# Patient Record
Sex: Female | Born: 1935 | Race: White | Hispanic: No | Smoking: Former smoker
Health system: Southern US, Community
[De-identification: ages and names within clinical notes are randomized; demographics above are authoritative.]

## PROBLEM LIST (undated history)

## (undated) DIAGNOSIS — R350 Frequency of micturition: Secondary | ICD-10-CM

## (undated) DIAGNOSIS — K5790 Diverticulosis of intestine, part unspecified, without perforation or abscess without bleeding: Secondary | ICD-10-CM

## (undated) DIAGNOSIS — K219 Gastro-esophageal reflux disease without esophagitis: Secondary | ICD-10-CM

## (undated) DIAGNOSIS — M1990 Unspecified osteoarthritis, unspecified site: Secondary | ICD-10-CM

## (undated) DIAGNOSIS — E039 Hypothyroidism, unspecified: Secondary | ICD-10-CM

## (undated) DIAGNOSIS — C801 Malignant (primary) neoplasm, unspecified: Secondary | ICD-10-CM

## (undated) DIAGNOSIS — R011 Cardiac murmur, unspecified: Secondary | ICD-10-CM

## (undated) HISTORY — PX: ABDOMINAL HYSTERECTOMY: SHX81

## (undated) HISTORY — PX: SHOULDER ARTHROSCOPY: SHX128

## (undated) HISTORY — PX: BACK SURGERY: SHX140

## (undated) HISTORY — PX: CHOLECYSTECTOMY: SHX55

## (undated) HISTORY — PX: ESOPHAGOGASTRODUODENOSCOPY: SHX1529

## (undated) HISTORY — PX: APPENDECTOMY: SHX54

## (undated) HISTORY — PX: COLONOSCOPY: SHX174

## (undated) HISTORY — PX: TONSILLECTOMY: SUR1361

## (undated) HISTORY — PX: CATARACT EXTRACTION: SUR2

## (undated) HISTORY — PX: LAPAROTOMY: SHX154

## (undated) HISTORY — PX: CARPAL TUNNEL RELEASE: SHX101

## (undated) HISTORY — PX: KNEE ARTHROSCOPY: SUR90

---

## 1986-02-13 HISTORY — PX: HERNIA REPAIR: SHX51

## 2003-02-13 ENCOUNTER — Other Ambulatory Visit: Payer: Self-pay

## 2003-03-04 ENCOUNTER — Inpatient Hospital Stay (HOSPITAL_COMMUNITY): Admission: RE | Admit: 2003-03-04 | Discharge: 2003-03-05 | Payer: Self-pay | Admitting: Neurosurgery

## 2003-04-09 ENCOUNTER — Encounter: Admission: RE | Admit: 2003-04-09 | Discharge: 2003-04-09 | Payer: Self-pay | Admitting: Neurosurgery

## 2003-05-21 ENCOUNTER — Encounter: Admission: RE | Admit: 2003-05-21 | Discharge: 2003-05-21 | Payer: Self-pay | Admitting: Neurosurgery

## 2004-09-20 ENCOUNTER — Ambulatory Visit: Payer: Self-pay | Admitting: Internal Medicine

## 2005-10-17 ENCOUNTER — Ambulatory Visit: Payer: Self-pay | Admitting: Internal Medicine

## 2006-03-23 ENCOUNTER — Ambulatory Visit: Payer: Self-pay | Admitting: Unknown Physician Specialty

## 2006-03-26 ENCOUNTER — Ambulatory Visit: Payer: Self-pay | Admitting: Unknown Physician Specialty

## 2006-05-29 ENCOUNTER — Emergency Department: Payer: Self-pay | Admitting: Emergency Medicine

## 2006-06-05 ENCOUNTER — Ambulatory Visit: Payer: Self-pay

## 2006-06-20 ENCOUNTER — Inpatient Hospital Stay (HOSPITAL_COMMUNITY): Admission: RE | Admit: 2006-06-20 | Discharge: 2006-06-22 | Payer: Self-pay | Admitting: Neurosurgery

## 2006-08-29 ENCOUNTER — Ambulatory Visit: Payer: Self-pay | Admitting: Internal Medicine

## 2006-10-11 ENCOUNTER — Encounter: Admission: RE | Admit: 2006-10-11 | Discharge: 2006-10-11 | Payer: Self-pay | Admitting: Neurosurgery

## 2006-10-25 ENCOUNTER — Ambulatory Visit: Payer: Self-pay | Admitting: Internal Medicine

## 2006-12-18 ENCOUNTER — Encounter: Admission: RE | Admit: 2006-12-18 | Discharge: 2006-12-18 | Payer: Self-pay | Admitting: Neurosurgery

## 2007-02-20 ENCOUNTER — Ambulatory Visit: Payer: Self-pay | Admitting: Unknown Physician Specialty

## 2007-04-05 ENCOUNTER — Ambulatory Visit: Payer: Self-pay | Admitting: Unknown Physician Specialty

## 2007-04-12 ENCOUNTER — Ambulatory Visit: Payer: Self-pay | Admitting: Unknown Physician Specialty

## 2007-05-17 ENCOUNTER — Ambulatory Visit: Payer: Self-pay | Admitting: Unknown Physician Specialty

## 2007-07-18 ENCOUNTER — Ambulatory Visit: Payer: Self-pay | Admitting: Unknown Physician Specialty

## 2007-07-25 ENCOUNTER — Ambulatory Visit: Payer: Self-pay | Admitting: Unknown Physician Specialty

## 2007-10-29 ENCOUNTER — Ambulatory Visit: Payer: Self-pay | Admitting: Internal Medicine

## 2008-04-08 ENCOUNTER — Ambulatory Visit: Payer: Self-pay | Admitting: Specialist

## 2008-05-06 ENCOUNTER — Ambulatory Visit: Payer: Self-pay | Admitting: Specialist

## 2008-12-10 ENCOUNTER — Ambulatory Visit: Payer: Self-pay | Admitting: Internal Medicine

## 2009-02-10 ENCOUNTER — Ambulatory Visit: Payer: Self-pay | Admitting: Ophthalmology

## 2009-04-21 ENCOUNTER — Ambulatory Visit: Payer: Self-pay | Admitting: Ophthalmology

## 2009-05-17 ENCOUNTER — Ambulatory Visit: Payer: Self-pay | Admitting: Neurosurgery

## 2009-06-28 ENCOUNTER — Ambulatory Visit: Payer: Self-pay | Admitting: Pain Medicine

## 2009-07-05 ENCOUNTER — Ambulatory Visit: Payer: Self-pay | Admitting: Pain Medicine

## 2009-08-03 ENCOUNTER — Ambulatory Visit: Payer: Self-pay | Admitting: Pain Medicine

## 2009-08-11 ENCOUNTER — Ambulatory Visit: Payer: Self-pay | Admitting: Pain Medicine

## 2009-11-01 ENCOUNTER — Ambulatory Visit: Payer: Self-pay | Admitting: Pain Medicine

## 2009-11-10 ENCOUNTER — Ambulatory Visit: Payer: Self-pay | Admitting: Pain Medicine

## 2009-11-28 ENCOUNTER — Emergency Department: Payer: Self-pay | Admitting: Emergency Medicine

## 2009-12-13 ENCOUNTER — Ambulatory Visit: Payer: Self-pay | Admitting: Internal Medicine

## 2010-04-11 ENCOUNTER — Ambulatory Visit: Payer: Self-pay | Admitting: Internal Medicine

## 2010-04-15 ENCOUNTER — Ambulatory Visit: Payer: Self-pay | Admitting: Unknown Physician Specialty

## 2010-05-06 ENCOUNTER — Ambulatory Visit: Payer: Self-pay | Admitting: Specialist

## 2010-05-11 ENCOUNTER — Ambulatory Visit: Payer: Self-pay | Admitting: Specialist

## 2010-07-01 NOTE — Op Note (Signed)
NAMEAALA, Diana Hale               ACCOUNT NO.:  [id number]   MEDICAL RECORD NO.:  [id number]          PATIENT TYPE:  INP   LOCATION:  3014                         FACILITY:  [facility]   PHYSICIAN:  Donalee Citrin, M.D.        DATE OF BIRTH:  Jun 29, 1931   DATE OF PROCEDURE:  06/20/2006  DATE OF DISCHARGE:  05/15/2006                               OPERATIVE REPORT   PREOPERATIVE DIAGNOSIS:  Grade I spondylolisthesis, degenerative L4-L5,  with severe right greater than left L5 radiculopathy; lumbar spinal  stenosis; and mechanical back pain.   PROCEDURE PERFORMED:  Gill decompression, L4-L5, posterior lumbar  interbody fusion, L4-L5 using a hybrid Telamon 10 x 22 mm PEEK cage  packed with locally harvested allograft mixed with Progenics bone  substitute as well as 10 x 26 mm Tangent allograft wedge.  Pedicle screw  fixation, L4-L5, using the 6.35 Legacy pedicle screw system.  Posterolateral arthrodesis, L4-L5, again using locally harvested  allograft mixed with Progenics bone substitute.  Open reduction of  spinal deformity, L4-L5.   SURGEON:  Donalee Citrin, M.D.   ASSISTANT SURGEON:  Kathaleen Maser. Pool, M.D.   ANESTHESIA:  General endotracheal.   HISTORY OF PRESENT ILLNESS:  The patient is a very pleasant 77 year old  female who has had back and right greater than left leg pain that has  progressively worsened over the last several days and weeks.  The  patient presented with weakness in dorsiflexion of the foot with a  partial foot drop and EHL weakness.  The patient's imaging showed severe  lumbar spinal stenosis due to a degenerative grade I spondylolisthesis  at L4-L5 causing severe biforaminal stenosis, severe arthropathy and  lumbar central canal stenosis.  Due to the patient's failure at  conservative treatment, preoperative imaging and physical examination  findings of a partial foot drop, the patient was recommended  decompression and stabilization procedure.  The risks and benefits  of  the procedure were explained to the patient and understands and agrees  to support.   DESCRIPTION OF PROCEDURE:  The patient was brought into the operating  room under general anesthesia, positioned prone, usual sterile fashion  and prepped.  C-arm localized the L4-L5 interspace.  After infiltration  of 10 mL of lidocaine with epinephrine, a midline incision was made and  Bovie electrocautery was used to take down and subperiosteal dissection  was carried out to the lamina of L4 and L5, exposing the TPs at L4 and  L5 as well.  Intraoperative x-ray confirmed localization at the  appropriate level.  There was a marked degeneration, facet arthropathy  and diastasis of both 4-5 facets.  This was all bitten with a Leksell  rongeur.  The spine processes were then removed and there was severe  central stenosis as well, and this was teased away with a #4 Penfield  and removed in piecemeal fashion with the 3 and 4 mm Kerrison sponges.   After complete central decompression was begun, findings included severe  facet displacement with the medial part of the facet displacing into the  thecal sac causing severe stenosis in  the proximal L5 nerve root on that  side.  This was teased away with the #4 Penfield and removed in  piecemeal fashion to where both the 4 and the 5 roots were skeletonized  out the foramen and flush with the pedicle.  The medial part of pedicle  was drilled down as well as distally just slightly on the superior  aspect of the facet at L4-L5 and inferior aspect of the facet of 3-4 to  obtain aggressive decompression of both 4 roots.  After the 4 roots had  been decompressed, and the 5 root was flushed with the pedicles, taken  to interbody work and epidural branch was coagulated, the interspace was  entered on the left side, cleaned out with the Leksell rongeur, and a  #10 distractor was inserted.  With sequential distraction, the slip was  reduced to just a millimeter from a  start value of about 3 or 4 mm.  Then on the right side the interspace was cleaned out again.  Then,  using a size 10 cutter and chisel, endplates were scraped and prepared  to receive the allograft.  Fluoroscopy was used at each step along the  way to confirm depth and trajectory.  The endplates were scraped  centrally as well as laterally.   A PEEK cage packed with locally harvested allograft mixed with Progenics  was then placed on the patient's right side approximately 2 mm deep to  the posterior vertebral body line.  Then on the left side the distractor  was removed and fluoroscopy confirmed good position of the cage.  Then  on the left side in similar fashion, the interspace was prepared.  The  large central disk herniation was removed.  The endplates were scraped.  A Tangent allograft was inserted on the patient's left side.  Then  pedicle screw placement was placed.  Using a high speed drill, a pilot  hole drilled at L4 on the right, cannulated with the awl, probed from  both within the pedicle and within the canal, tapped with a 5-5 tap and  a 6 x 45 screw was inserted after probing after the tapping, and the  screw had excellent purchase.  Fluoroscopy confirmed good position of  the screw and bony landmarks confirmed trajectory and position to  confirm the medial lateral breach.  The L5 screw was inserted in a  similar fashion, and the L4-L5 screws on the left side were also  inserted in similar fashion.   The wound was then copiously irrigated.  Meticulous hemostasis was  obtained.  Aggressive decortication was carried out in the lateral  gutters with the TPs and lateral facet complexes at L4-L5.  The  remainder of the autograft with Progenics substitute was packed  laterally.  Then a 40 mm rod was inserted, tapped down and tightened  down at L5.  The upper pedicle screws was compressed against L5.  The foramina were then reexplored with a _______ hockey stick and noted to   be widely decompressed.  Then meticulous hemostasis was maintained.  Gelfoam was onlaid on top of the dura.  The medium Hemovac drain was  placed and the wound was closed in layers with interrupted Vicryl, with  a running 4-0 subcuticular in the skin.  Benzoin and Steri-Strips were  applied.  The patient was taken to the recovery room in stable  condition.  At the end of the case, needle, instrument and sponge counts  were correct.  ______________________________  Donalee Citrin, M.D.     GC/MEDQ  D:  06/20/2006  T:  06/20/2006  Job:  [id number]

## 2010-07-01 NOTE — Op Note (Signed)
Diana Hale, Diana Hale                           ACCOUNT NO.:  [id number]   MEDICAL RECORD NO.:  [id number]                   PATIENT TYPE:  OIB   LOCATION:  2899                                 FACILITY:  [facility]   PHYSICIAN:  Donalee Citrin, M.D.                     DATE OF BIRTH:  11-11-1931   DATE OF PROCEDURE:  03/04/2003  DATE OF DISCHARGE:                                 OPERATIVE REPORT   PREOPERATIVE DIAGNOSIS:  Cervical spondylosis with left C5 and C6  radiculopathy.   POSTOPERATIVE DIAGNOSIS:  Cervical spondylosis with left C5 and C6  radiculopathy.   OPERATION PERFORMED:  Anterior cervical diskectomy and fusion at C4-5 and C5-  6 using  7mm patellar wedges at C4-5 and C5-6, a 40 mm Atlantis Vision plate  and six 13 mm variable angle screws.   SURGEON:  Donalee Citrin, M.D.   ASSISTANT:  Tia Alert, MD   ANESTHESIA:  General endotracheal.   INDICATIONS FOR PROCEDURE:  The patient is a very pleasant 77 year old  female who has had longstanding neck and left arm pain radiating down  through her shoulder down to her elbow, into her thumb and forefinger with  numbness and tingling in the same distribution.  The patient preoperatively  had triceps weakness 4+ out of 5.  Preoperative imaging showed severe  spondylosis with foraminal stenosis at C5 and C6 nerve roots on the left.  As the patient has failed all modalities of conservative treatment the  patient was recommended anterior cervical diskectomy and fusion.  I  extensively went over the risks and benefits of surgery with her.  She  understands and agreed to proceed forward.   DESCRIPTION OF PROCEDURE:  The patient was brought to the operating room was  induced under general anesthesia, positioned supine with neck in slight  extension with five pounds of halter traction.  The right side of the neck  was prepped and draped in the usual sterile fashion.  A preoperative x-ray  localized the C4-5 disk space.  A curvilinear  incision was made just  inferior to the anterior border of the sternocleidomastoid muscle just off  the midline.  The superficial layer of the platysmas was dissected out and  divided longitudinally.  The avascular plane between the sternocleidomastoid  muscle and the strap muscles was developed down to prevertebral fascia.  The  prevertebral fascia was dissected with Kittners.  Then intraoperative x-ray  confirmed localization of the C4-5 disk space.  An annulotomy was made with  a 15 blade scalpel.  Pituitary rongeurs were used on the disk space.  Then  the ____________  longus colli reflected laterally. Self-retaining retractor  was placed.  Then using a 15 blade scalpel, the annulotomy was extended at  both levels.  Pituitary rongeurs were used to remove the anterior margin of  the annulus.  A high speed drill was used  to drill down both the interspaces  to the posterior osteophytic complexes and posterior annulus.  Then the  operating microscope was draped and brought into the field.  Under  microscopic illumination first the C4-5 disk space a large osteophyte was  drilled down predominantly coming off the C5 vertebral body.  This was  underbitten with a  1 and 2 mm Kerrison punch exposing the posterior  longitudinal ligament which was removed in piecemeal fashion with a 1 and 2  mm Kerrison punch.  The thecal sac was then visualized.  It was decompressed  centrally from a large osteophytic complex coming off the C4 as well as C5  vertebral bodies.  Then the proximal aspect of the C5 nerve root was  identified and radically decompressed out its foramen, explored with an  angled nerve hook and noted to have no further stenosis.  Then the proximal  aspect of the right C5 neural foramen was identified and decompressed.  Then  the end plates were scraped with a VA curet and Gelfoam was placed.  Then  attention was taken to the C5-6.  The C5-6 disk spaces were adequately  cleaned out.   Severe spondylosis, significant hypertrophy, and osteophytic  complex coming off the C5 vertebral body was noted to be decompressed along  the thecal sac and left C6 nerve root.  This was all underbitten with a 1  and 2 mm Kerrison punch.  Posterior longitudinal ligament was removed in  piecemeal fashion exposing the thecal sac and the left C6 nerve root.  The  pedicle of C6 was identified.  The C6 nerve root was identified and  decompressed at its foramen above the pedicle, explored with angled nerve  hook and noted to have no further stenosis.  Then the central thecal sac and  right C6 nerve root was decompressed.  Large osteophytic complex noted at  the C5 vertebral body centrally was under bitten and then meticulous  hemostasis was maintained, Surgifoam Gelfoam was placed in the interspace.  End plates were scraped to prepare some bone graft.  Two 7 mm patellar  wedges were sized, selected and inserted 1 to 2 mm deep to the anterior  vertebral body line.  Then a 40 mm Atlantis Vision plate was sized, selected  and six 13 mm variable angle screws were drilled and placed.  All screws had  excellent purchase.  Screws were tightened.  Fluoroscopy confirmed good  position of plate, screws and bone graft.  The wound was copiously irrigated  and meticulous hemostasis was maintained.  The platysma was reapproximated  with 3-0 interrupted Vicryl.  The subcutaneous tissues closed with a running  4-0 subcuticular.  Benzoin and Steri-Strips were applied.  The patient was  then transferred to the recovery room in stable condition.  At the end of  the case, sponge, needle and instrument counts were correct.                                               Donalee Citrin, M.D.    GC/MEDQ  D:  03/04/2003  T:  03/05/2003  Job:  [id number]

## 2010-07-11 ENCOUNTER — Ambulatory Visit: Payer: Self-pay | Admitting: Physical Medicine and Rehabilitation

## 2011-01-02 ENCOUNTER — Ambulatory Visit: Payer: Self-pay | Admitting: Internal Medicine

## 2011-04-10 ENCOUNTER — Ambulatory Visit: Payer: Self-pay | Admitting: Unknown Physician Specialty

## 2011-04-28 ENCOUNTER — Encounter (HOSPITAL_COMMUNITY): Payer: Self-pay | Admitting: *Deleted

## 2011-04-28 ENCOUNTER — Encounter (HOSPITAL_COMMUNITY): Payer: Self-pay | Admitting: Respiratory Therapy

## 2011-04-28 ENCOUNTER — Other Ambulatory Visit (HOSPITAL_COMMUNITY): Payer: Self-pay | Admitting: Neurosurgery

## 2011-04-28 DIAGNOSIS — M48061 Spinal stenosis, lumbar region without neurogenic claudication: Secondary | ICD-10-CM

## 2011-04-28 NOTE — Progress Notes (Addendum)
Left message for Diana Hale; unable to tell which medications the surgeon is ordering, requested return phone call for clarification.

## 2011-05-01 ENCOUNTER — Other Ambulatory Visit: Payer: Self-pay

## 2011-05-01 ENCOUNTER — Inpatient Hospital Stay (HOSPITAL_COMMUNITY)
Admission: RE | Admit: 2011-05-01 | Discharge: 2011-05-04 | DRG: 460 | Disposition: A | Discharge status: Home / Self Care | Payer: Medicare Other | Source: Ambulatory Visit | Attending: Neurosurgery | Admitting: Neurosurgery

## 2011-05-01 ENCOUNTER — Inpatient Hospital Stay (HOSPITAL_COMMUNITY): Payer: Medicare Other | Admitting: Anesthesiology

## 2011-05-01 ENCOUNTER — Inpatient Hospital Stay (HOSPITAL_COMMUNITY): Payer: Medicare Other

## 2011-05-01 ENCOUNTER — Encounter (HOSPITAL_COMMUNITY): Payer: Self-pay | Admitting: Anesthesiology

## 2011-05-01 ENCOUNTER — Encounter (HOSPITAL_COMMUNITY): Payer: Self-pay | Admitting: Surgery

## 2011-05-01 ENCOUNTER — Encounter (HOSPITAL_COMMUNITY)
Admission: RE | Disposition: A | Discharge status: Home / Self Care | Payer: Self-pay | Source: Ambulatory Visit | Attending: Neurosurgery

## 2011-05-01 ENCOUNTER — Ambulatory Visit (HOSPITAL_COMMUNITY)
Admission: RE | Admit: 2011-05-01 | Discharge: 2011-05-01 | Disposition: A | Discharge status: Home / Self Care | Payer: Medicare Other | Source: Ambulatory Visit | Attending: Neurosurgery | Admitting: Neurosurgery

## 2011-05-01 ENCOUNTER — Encounter (HOSPITAL_COMMUNITY): Payer: Self-pay | Admitting: *Deleted

## 2011-05-01 DIAGNOSIS — Z888 Allergy status to other drugs, medicaments and biological substances status: Secondary | ICD-10-CM

## 2011-05-01 DIAGNOSIS — M545 Low back pain, unspecified: Secondary | ICD-10-CM | POA: Insufficient documentation

## 2011-05-01 DIAGNOSIS — Z981 Arthrodesis status: Secondary | ICD-10-CM

## 2011-05-01 DIAGNOSIS — M48061 Spinal stenosis, lumbar region without neurogenic claudication: Secondary | ICD-10-CM

## 2011-05-01 DIAGNOSIS — Z794 Long term (current) use of insulin: Secondary | ICD-10-CM

## 2011-05-01 DIAGNOSIS — R350 Frequency of micturition: Secondary | ICD-10-CM | POA: Diagnosis present

## 2011-05-01 DIAGNOSIS — Z79899 Other long term (current) drug therapy: Secondary | ICD-10-CM

## 2011-05-01 DIAGNOSIS — E039 Hypothyroidism, unspecified: Secondary | ICD-10-CM | POA: Diagnosis present

## 2011-05-01 DIAGNOSIS — M79609 Pain in unspecified limb: Secondary | ICD-10-CM | POA: Insufficient documentation

## 2011-05-01 DIAGNOSIS — Z87891 Personal history of nicotine dependence: Secondary | ICD-10-CM

## 2011-05-01 DIAGNOSIS — M47817 Spondylosis without myelopathy or radiculopathy, lumbosacral region: Secondary | ICD-10-CM | POA: Insufficient documentation

## 2011-05-01 DIAGNOSIS — Z01812 Encounter for preprocedural laboratory examination: Secondary | ICD-10-CM

## 2011-05-01 DIAGNOSIS — M5126 Other intervertebral disc displacement, lumbar region: Principal | ICD-10-CM | POA: Diagnosis present

## 2011-05-01 DIAGNOSIS — M519 Unspecified thoracic, thoracolumbar and lumbosacral intervertebral disc disorder: Secondary | ICD-10-CM | POA: Insufficient documentation

## 2011-05-01 DIAGNOSIS — M129 Arthropathy, unspecified: Secondary | ICD-10-CM | POA: Diagnosis present

## 2011-05-01 DIAGNOSIS — E119 Type 2 diabetes mellitus without complications: Secondary | ICD-10-CM | POA: Diagnosis present

## 2011-05-01 DIAGNOSIS — K219 Gastro-esophageal reflux disease without esophagitis: Secondary | ICD-10-CM | POA: Diagnosis present

## 2011-05-01 HISTORY — DX: Diverticulosis of intestine, part unspecified, without perforation or abscess without bleeding: K57.90

## 2011-05-01 HISTORY — DX: Unspecified osteoarthritis, unspecified site: M19.90

## 2011-05-01 HISTORY — DX: Hypothyroidism, unspecified: E03.9

## 2011-05-01 HISTORY — DX: Frequency of micturition: R35.0

## 2011-05-01 HISTORY — DX: Gastro-esophageal reflux disease without esophagitis: K21.9

## 2011-05-01 LAB — BASIC METABOLIC PANEL
BUN: 14 mg/dL (ref 6–23)
Calcium: 9.5 mg/dL (ref 8.4–10.5)
Creatinine, Ser: 0.81 mg/dL (ref 0.50–1.10)
GFR calc Af Amer: 79 mL/min — ABNORMAL LOW (ref >90)
GFR calc non Af Amer: 68 mL/min — ABNORMAL LOW (ref >90)

## 2011-05-01 LAB — TYPE AND SCREEN
ABO/RH(D): A POS
Antibody Screen: NEGATIVE

## 2011-05-01 LAB — SURGICAL PCR SCREEN
MRSA, PCR: NEGATIVE
Staphylococcus aureus: NEGATIVE

## 2011-05-01 LAB — CBC
HCT: 39.4 % (ref 36.0–46.0)
MCHC: 33 g/dL (ref 30.0–36.0)
Platelets: 277 10*3/uL (ref 150–400)
RDW: 13.5 % (ref 11.5–15.5)
WBC: 7.7 10*3/uL (ref 4.0–10.5)

## 2011-05-01 LAB — GLUCOSE, CAPILLARY: Glucose-Capillary: 121 mg/dL — ABNORMAL HIGH (ref 70–99)

## 2011-05-01 SURGERY — POSTERIOR LUMBAR FUSION 1 WITH HARDWARE REMOVAL
Anesthesia: General | Site: Back | Laterality: Bilateral | Wound class: Clean

## 2011-05-01 MED ORDER — HYDROCODONE-ACETAMINOPHEN 5-325 MG PO TABS
2.0000 | ORAL_TABLET | ORAL | Status: DC | PRN
  Administered 2011-05-01 – 2011-05-03 (×10): 2 via ORAL
  Filled 2011-05-01 (×10): qty 2

## 2011-05-01 MED ORDER — METFORMIN HCL 500 MG PO TABS
500.0000 mg | ORAL_TABLET | Freq: Two times a day (BID) | ORAL | Status: DC
  Administered 2011-05-02 – 2011-05-04 (×5): 500 mg via ORAL
  Filled 2011-05-01 (×8): qty 1

## 2011-05-01 MED ORDER — MUPIROCIN 2 % EX OINT
TOPICAL_OINTMENT | CUTANEOUS | Status: AC
  Administered 2011-05-01: 1 via NASAL
  Filled 2011-05-01: qty 22

## 2011-05-01 MED ORDER — PROPOFOL 10 MG/ML IV EMUL
INTRAVENOUS | Status: DC | PRN
  Administered 2011-05-01: 180 mg via INTRAVENOUS

## 2011-05-01 MED ORDER — NEOSTIGMINE METHYLSULFATE 1 MG/ML IJ SOLN
INTRAMUSCULAR | Status: DC | PRN
  Administered 2011-05-01: 5 mg via INTRAVENOUS

## 2011-05-01 MED ORDER — ONDANSETRON HCL 4 MG/2ML IJ SOLN: 4.0000 mg | Freq: Once | INTRAMUSCULAR | Status: DC | PRN

## 2011-05-01 MED ORDER — PANTOPRAZOLE SODIUM 40 MG PO TBEC
40.0000 mg | DELAYED_RELEASE_TABLET | Freq: Two times a day (BID) | ORAL | Status: DC
  Administered 2011-05-02 – 2011-05-04 (×5): 40 mg via ORAL
  Filled 2011-05-01 (×4): qty 1

## 2011-05-01 MED ORDER — MORPHINE SULFATE 4 MG/ML IJ SOLN: 0.0500 mg/kg | INTRAMUSCULAR | Status: DC | PRN

## 2011-05-01 MED ORDER — HYDROMORPHONE HCL PF 1 MG/ML IJ SOLN
INTRAMUSCULAR | Status: AC
  Filled 2011-05-01: qty 1

## 2011-05-01 MED ORDER — ACETAMINOPHEN 325 MG PO TABS: 650.0000 mg | ORAL_TABLET | ORAL | Status: DC | PRN

## 2011-05-01 MED ORDER — MENTHOL 3 MG MT LOZG: 1.0000 | LOZENGE | OROMUCOSAL | Status: DC | PRN

## 2011-05-01 MED ORDER — HYDROMORPHONE HCL PF 1 MG/ML IJ SOLN
0.5000 mg | INTRAMUSCULAR | Status: DC | PRN
  Administered 2011-05-01 – 2011-05-04 (×14): 1 mg via INTRAVENOUS
  Filled 2011-05-01 (×13): qty 1

## 2011-05-01 MED ORDER — CYCLOBENZAPRINE HCL 10 MG PO TABS
10.0000 mg | ORAL_TABLET | Freq: Three times a day (TID) | ORAL | Status: DC | PRN
  Administered 2011-05-01 – 2011-05-03 (×5): 10 mg via ORAL
  Filled 2011-05-01 (×6): qty 1

## 2011-05-01 MED ORDER — INSULIN ASPART PROT & ASPART (70-30 MIX) 100 UNIT/ML ~~LOC~~ SUSP
40.0000 [IU] | Freq: Two times a day (BID) | SUBCUTANEOUS | Status: DC
  Administered 2011-05-02 (×2): 40 [IU] via SUBCUTANEOUS
  Filled 2011-05-01: qty 3

## 2011-05-01 MED ORDER — SODIUM CHLORIDE 0.9 % IV SOLN
INTRAVENOUS | Status: AC
  Filled 2011-05-01: qty 500

## 2011-05-01 MED ORDER — LEVOTHYROXINE SODIUM 75 MCG PO TABS
75.0000 ug | ORAL_TABLET | Freq: Every day | ORAL | Status: DC
  Administered 2011-05-02 – 2011-05-04 (×3): 75 ug via ORAL
  Filled 2011-05-01 (×4): qty 1

## 2011-05-01 MED ORDER — LACTATED RINGERS IV SOLN
INTRAVENOUS | Status: DC | PRN
  Administered 2011-05-01 (×3): via INTRAVENOUS

## 2011-05-01 MED ORDER — POLYETHYLENE GLYCOL 3350 17 G PO PACK
17.0000 g | PACK | Freq: Two times a day (BID) | ORAL | Status: DC
  Administered 2011-05-02 – 2011-05-04 (×4): 17 g via ORAL
  Filled 2011-05-01 (×7): qty 1

## 2011-05-01 MED ORDER — LIDOCAINE-EPINEPHRINE 1 %-1:100000 IJ SOLN
INTRAMUSCULAR | Status: DC | PRN
  Administered 2011-05-01: 10 mL

## 2011-05-01 MED ORDER — DEXAMETHASONE SODIUM PHOSPHATE 10 MG/ML IJ SOLN
10.0000 mg | Freq: Once | INTRAMUSCULAR | Status: DC
  Filled 2011-05-01 (×2): qty 1

## 2011-05-01 MED ORDER — ROCURONIUM BROMIDE 100 MG/10ML IV SOLN
INTRAVENOUS | Status: DC | PRN
  Administered 2011-05-01: 50 mg via INTRAVENOUS

## 2011-05-01 MED ORDER — AZELASTINE HCL 0.1 % NA SOLN
1.0000 | Freq: Two times a day (BID) | NASAL | Status: DC
  Administered 2011-05-02 – 2011-05-04 (×5): 1 via NASAL
  Filled 2011-05-01: qty 30

## 2011-05-01 MED ORDER — FENTANYL CITRATE 0.05 MG/ML IJ SOLN
INTRAMUSCULAR | Status: DC | PRN
  Administered 2011-05-01: 100 ug via INTRAVENOUS
  Administered 2011-05-01 (×4): 50 ug via INTRAVENOUS

## 2011-05-01 MED ORDER — ALUM & MAG HYDROXIDE-SIMETH 200-200-20 MG/5ML PO SUSP: 30.0000 mL | Freq: Four times a day (QID) | ORAL | Status: DC | PRN

## 2011-05-01 MED ORDER — PHENOL 1.4 % MT LIQD: 1.0000 | OROMUCOSAL | Status: DC | PRN

## 2011-05-01 MED ORDER — ONDANSETRON HCL 4 MG/2ML IJ SOLN
4.0000 mg | INTRAMUSCULAR | Status: DC | PRN
  Administered 2011-05-02: 4 mg via INTRAVENOUS
  Filled 2011-05-01: qty 2

## 2011-05-01 MED ORDER — MIDAZOLAM HCL 5 MG/5ML IJ SOLN
INTRAMUSCULAR | Status: DC | PRN
  Administered 2011-05-01 (×2): 1 mg via INTRAVENOUS

## 2011-05-01 MED ORDER — LIDOCAINE HCL (CARDIAC) 20 MG/ML IV SOLN
INTRAVENOUS | Status: DC | PRN
  Administered 2011-05-01: 60 mg via INTRAVENOUS

## 2011-05-01 MED ORDER — BUPIVACAINE HCL (PF) 0.25 % IJ SOLN
INTRAMUSCULAR | Status: DC | PRN
  Administered 2011-05-01: 10 mL

## 2011-05-01 MED ORDER — HETASTARCH-ELECTROLYTES 6 % IV SOLN
INTRAVENOUS | Status: DC | PRN
  Administered 2011-05-01: 16:00:00 via INTRAVENOUS

## 2011-05-01 MED ORDER — BACITRACIN 50000 UNITS IM SOLR
INTRAMUSCULAR | Status: AC
  Filled 2011-05-01: qty 1

## 2011-05-01 MED ORDER — THROMBIN 20000 UNITS EX KIT
PACK | CUTANEOUS | Status: DC | PRN
  Administered 2011-05-01 (×2): via TOPICAL

## 2011-05-01 MED ORDER — CEFAZOLIN SODIUM-DEXTROSE 2-3 GM-% IV SOLR
2.0000 g | Freq: Once | INTRAVENOUS | Status: AC
  Administered 2011-05-01: 2 g via INTRAVENOUS
  Filled 2011-05-01: qty 50

## 2011-05-01 MED ORDER — TOLTERODINE TARTRATE ER 4 MG PO CP24
4.0000 mg | ORAL_CAPSULE | Freq: Every day | ORAL | Status: DC
  Administered 2011-05-02 – 2011-05-04 (×3): 4 mg via ORAL
  Filled 2011-05-01 (×4): qty 1

## 2011-05-01 MED ORDER — GLYCOPYRROLATE 0.2 MG/ML IJ SOLN
INTRAMUSCULAR | Status: DC | PRN
  Administered 2011-05-01: 0.6 mg via INTRAVENOUS

## 2011-05-01 MED ORDER — 0.9 % SODIUM CHLORIDE (POUR BTL) OPTIME
TOPICAL | Status: DC | PRN
  Administered 2011-05-01: 1000 mL

## 2011-05-01 MED ORDER — VECURONIUM BROMIDE 10 MG IV SOLR
INTRAVENOUS | Status: DC | PRN
  Administered 2011-05-01: 1 mg via INTRAVENOUS
  Administered 2011-05-01: 2 mg via INTRAVENOUS
  Administered 2011-05-01: 1 mg via INTRAVENOUS
  Administered 2011-05-01 (×3): 2 mg via INTRAVENOUS

## 2011-05-01 MED ORDER — ONDANSETRON HCL 4 MG/2ML IJ SOLN
INTRAMUSCULAR | Status: DC | PRN
  Administered 2011-05-01: 4 mg via INTRAVENOUS

## 2011-05-01 MED ORDER — HYDROMORPHONE HCL PF 1 MG/ML IJ SOLN
0.2500 mg | INTRAMUSCULAR | Status: DC | PRN
  Administered 2011-05-01 (×4): 0.5 mg via INTRAVENOUS

## 2011-05-01 MED ORDER — CEFAZOLIN SODIUM 1-5 GM-% IV SOLN
1.0000 g | Freq: Three times a day (TID) | INTRAVENOUS | Status: AC
  Administered 2011-05-01 – 2011-05-02 (×2): 1 g via INTRAVENOUS
  Filled 2011-05-01 (×2): qty 50

## 2011-05-01 MED ORDER — SODIUM CHLORIDE 0.9 % IJ SOLN
3.0000 mL | Freq: Two times a day (BID) | INTRAMUSCULAR | Status: DC
  Administered 2011-05-01 – 2011-05-04 (×6): 3 mL via INTRAVENOUS

## 2011-05-01 MED ORDER — SODIUM CHLORIDE 0.9 % IR SOLN
Status: DC | PRN
  Administered 2011-05-01: 15:00:00

## 2011-05-01 MED ORDER — ACETAMINOPHEN 650 MG RE SUPP: 650.0000 mg | RECTAL | Status: DC | PRN

## 2011-05-01 MED ORDER — CYCLOBENZAPRINE HCL 10 MG PO TABS
ORAL_TABLET | ORAL | Status: AC
  Filled 2011-05-01: qty 1

## 2011-05-01 SURGICAL SUPPLY — 75 items
ADH SKN CLS APL DERMABOND .7 (GAUZE/BANDAGES/DRESSINGS) ×1
APL SKNCLS STERI-STRIP NONHPOA (GAUZE/BANDAGES/DRESSINGS) ×1
BAG DECANTER FOR FLEXI CONT (MISCELLANEOUS) ×2 IMPLANT
BENZOIN TINCTURE PRP APPL 2/3 (GAUZE/BANDAGES/DRESSINGS) ×2 IMPLANT
BLADE SURG 11 STRL SS (BLADE) ×2 IMPLANT
BLADE SURG ROTATE 9660 (MISCELLANEOUS) IMPLANT
BRUSH SCRUB EZ PLAIN DRY (MISCELLANEOUS) ×2 IMPLANT
BUR MATCHSTICK NEURO 3.0 LAGG (BURR) ×2 IMPLANT
BUR PRECISION FLUTE 6.0 (BURR) ×2 IMPLANT
CANISTER SUCTION 2500CC (MISCELLANEOUS) ×2 IMPLANT
CLOTH BEACON ORANGE TIMEOUT ST (SAFETY) ×2 IMPLANT
CONT SPEC 4OZ CLIKSEAL STRL BL (MISCELLANEOUS) ×4 IMPLANT
COVER BACK TABLE 24X17X13 BIG (DRAPES) IMPLANT
COVER TABLE BACK 60X90 (DRAPES) ×2 IMPLANT
DECANTER SPIKE VIAL GLASS SM (MISCELLANEOUS) ×2 IMPLANT
DERMABOND ADVANCED (GAUZE/BANDAGES/DRESSINGS) ×1
DERMABOND ADVANCED .7 DNX12 (GAUZE/BANDAGES/DRESSINGS) ×1 IMPLANT
DRAPE C-ARM 42X72 X-RAY (DRAPES) ×4 IMPLANT
DRAPE LAPAROTOMY 100X72X124 (DRAPES) ×2 IMPLANT
DRAPE POUCH INSTRU U-SHP 10X18 (DRAPES) ×2 IMPLANT
DRAPE PROXIMA HALF (DRAPES) IMPLANT
DRAPE SURG 17X23 STRL (DRAPES) ×2 IMPLANT
DRSG OPSITE 4X5.5 SM (GAUZE/BANDAGES/DRESSINGS) ×3 IMPLANT
ELECT REM PT RETURN 9FT ADLT (ELECTROSURGICAL) ×2
ELECTRODE REM PT RTRN 9FT ADLT (ELECTROSURGICAL) ×1 IMPLANT
EVACUATOR 3/16  PVC DRAIN (DRAIN) ×1
EVACUATOR 3/16 PVC DRAIN (DRAIN) ×1 IMPLANT
GAUZE SPONGE 4X4 16PLY XRAY LF (GAUZE/BANDAGES/DRESSINGS) ×1 IMPLANT
GLOVE BIO SURGEON STRL SZ 6.5 (GLOVE) ×3 IMPLANT
GLOVE BIO SURGEON STRL SZ8 (GLOVE) ×4 IMPLANT
GLOVE BIOGEL PI IND STRL 7.0 (GLOVE) IMPLANT
GLOVE BIOGEL PI IND STRL 8.5 (GLOVE) IMPLANT
GLOVE BIOGEL PI INDICATOR 7.0 (GLOVE) ×1
GLOVE BIOGEL PI INDICATOR 8.5 (GLOVE) ×2
GLOVE ECLIPSE 7.5 STRL STRAW (GLOVE) IMPLANT
GLOVE EXAM NITRILE LRG STRL (GLOVE) IMPLANT
GLOVE EXAM NITRILE MD LF STRL (GLOVE) ×1 IMPLANT
GLOVE EXAM NITRILE XL STR (GLOVE) IMPLANT
GLOVE EXAM NITRILE XS STR PU (GLOVE) IMPLANT
GLOVE INDICATOR 8.5 STRL (GLOVE) ×4 IMPLANT
GLOVE SS BIOGEL STRL SZ 6.5 (GLOVE) IMPLANT
GLOVE SUPERSENSE BIOGEL SZ 6.5 (GLOVE) ×1
GLOVE SURG SS PI 8.0 STRL IVOR (GLOVE) ×3 IMPLANT
GOWN BRE IMP SLV AUR LG STRL (GOWN DISPOSABLE) ×1 IMPLANT
GOWN BRE IMP SLV AUR XL STRL (GOWN DISPOSABLE) ×4 IMPLANT
GOWN STRL REIN 2XL LVL4 (GOWN DISPOSABLE) ×2 IMPLANT
GRAFT BN 5X1XSPNE CVD POST DBM (Bone Implant) IMPLANT
GRAFT BONE MAGNIFUSE 1X5CM (Bone Implant) ×2 IMPLANT
KIT BASIN OR (CUSTOM PROCEDURE TRAY) ×2 IMPLANT
KIT ROOM TURNOVER OR (KITS) ×2 IMPLANT
NDL HYPO 25X1 1.5 SAFETY (NEEDLE) ×1 IMPLANT
NEEDLE HYPO 25X1 1.5 SAFETY (NEEDLE) ×2 IMPLANT
NS IRRIG 1000ML POUR BTL (IV SOLUTION) ×2 IMPLANT
PACK LAMINECTOMY NEURO (CUSTOM PROCEDURE TRAY) ×2 IMPLANT
PAD ARMBOARD 7.5X6 YLW CONV (MISCELLANEOUS) ×8 IMPLANT
PUTTY BONE DBX 5CC MIX (Putty) ×1 IMPLANT
ROD PREBENT 6.35X60 (Rod) ×2 IMPLANT
SCREW PEDICLE VA L635 6.5X45M (Screw) ×2 IMPLANT
SCREW SET NON BREAK OFF (Screw) ×6 IMPLANT
SPONGE GAUZE 4X4 12PLY (GAUZE/BANDAGES/DRESSINGS) ×2 IMPLANT
SPONGE LAP 4X18 X RAY DECT (DISPOSABLE) IMPLANT
SPONGE SURGIFOAM ABS GEL 100 (HEMOSTASIS) ×3 IMPLANT
STRIP CLOSURE SKIN 1/2X4 (GAUZE/BANDAGES/DRESSINGS) ×3 IMPLANT
SUT VIC AB 0 CT1 18XCR BRD8 (SUTURE) ×2 IMPLANT
SUT VIC AB 0 CT1 8-18 (SUTURE) ×4
SUT VIC AB 2-0 CT1 18 (SUTURE) ×2 IMPLANT
SUT VICRYL 4-0 PS2 18IN ABS (SUTURE) ×2 IMPLANT
SYR 20ML ECCENTRIC (SYRINGE) ×2 IMPLANT
TANGENT WEDGE IMPLANT
TELAMON 12X22 (Cage) ×1 IMPLANT
TOWEL OR 17X24 6PK STRL BLUE (TOWEL DISPOSABLE) ×2 IMPLANT
TOWEL OR 17X26 10 PK STRL BLUE (TOWEL DISPOSABLE) ×2 IMPLANT
TRAY FOLEY CATH 14FRSI W/METER (CATHETERS) ×2 IMPLANT
WATER STERILE IRR 1000ML POUR (IV SOLUTION) ×2 IMPLANT
WEDGE TANGENT 12X26MM IMPLANT

## 2011-05-01 NOTE — Anesthesia Preprocedure Evaluation (Addendum)
Anesthesia Evaluation  Patient identified by MRN, date of birth, ID band Patient awake    Reviewed: Allergy & Precautions, H&P , NPO status , Patient's Chart, lab work & pertinent test results  Airway Mallampati: I TM Distance: >3 FB Neck ROM: Full    Dental  (+) Edentulous Upper and Edentulous Lower   Pulmonary  breath sounds clear to auscultation        Cardiovascular Rhythm:Regular Rate:Normal     Neuro/Psych    GI/Hepatic   Endo/Other    Renal/GU      Musculoskeletal   Abdominal   Peds  Hematology   Anesthesia Other Findings   Reproductive/Obstetrics                          Anesthesia Physical Anesthesia Plan  ASA: III  Anesthesia Plan: General   Post-op Pain Management:    Induction: Intravenous  Airway Management Planned: Oral ETT  Additional Equipment:   Intra-op Plan:   Post-operative Plan: Extubation in OR  Informed Consent: I have reviewed the patients History and Physical, chart, labs and discussed the procedure including the risks, benefits and alternatives for the proposed anesthesia with the patient or authorized representative who has indicated his/her understanding and acceptance.   Dental advisory given  Plan Discussed with: CRNA, Anesthesiologist and Surgeon  Anesthesia Plan Comments:         Anesthesia Quick Evaluation

## 2011-05-01 NOTE — Transfer of Care (Signed)
Immediate Anesthesia Transfer of Care Note  Patient: Diana Hale  Procedure(s) Performed: Procedure(s) (LRB): POSTERIOR LUMBAR FUSION 1 WITH HARDWARE REMOVAL (Bilateral)  Patient Location: PACU  Anesthesia Type: General  Level of Consciousness: sedated  Airway & Oxygen Therapy: Patient Spontanous Breathing and Patient connected to face mask oxygen  Post-op Assessment: Report given to PACU RN and Post -op Vital signs reviewed and stable  Post vital signs: Reviewed and stable  Complications: No apparent anesthesia complications

## 2011-05-01 NOTE — Preoperative (Signed)
Beta Blockers   Reason not to administer Beta Blockers:Not Applicable 

## 2011-05-01 NOTE — Anesthesia Postprocedure Evaluation (Signed)
  Anesthesia Post-op Note  Patient: Diana Hale  Procedure(s) Performed: Procedure(s) (LRB): POSTERIOR LUMBAR FUSION 1 WITH HARDWARE REMOVAL (Bilateral)  Patient Location: PACU  Anesthesia Type: General  Level of Consciousness: awake, alert  and oriented  Airway and Oxygen Therapy: Patient Spontanous Breathing and Patient connected to nasal cannula oxygen  Post-op Pain: mild  Post-op Assessment: Post-op Vital signs reviewed, Patient's Cardiovascular Status Stable, Respiratory Function Stable, Patent Airway, No signs of Nausea or vomiting and Pain level controlled  Post-op Vital Signs: Reviewed and stable  Complications: No apparent anesthesia complications

## 2011-05-01 NOTE — Op Note (Signed)
Preoperative diagnosis: Herniated nucleus pulposus L3-4 right with lumbar instability L3-4 and lumbar spinal stenosis L3-4  Postoperative diagnosis: Same  Procedure: Decompressive lumbar laminectomy L3-4 and excess will be numerous interbody fusion posterior lumbar interbody fusion L3-4 using tangent allograft wedge and Telamon peek cage packable local autograft mixed DBX pedicle screw fixation using the 6.35 Legacy pedicle screw system. Expiration of fusion removal of hardware L4-5 posterior lateral arthrodesis L3-L4 using local autograft mixed with DBX (2 twice a large)  Surgeon: Jillyn Hidden [place] Passow  Anesthesia: Gen.  EBL: 700 with 350 given back as Cell Saver  History of present illness: Patient is a very pleasant 78 year female has aggressive worsening back and right leg pain over last several weeks and months with pain radiating down to her front of her shin and above her knee in the anterior margins of her thigh. Workup revealed a herniated nucleus pulposus above the level of her old fusion L4-5 with a solid-appearing fusion L4-5 due to the progression of clinical survey conservative treatment MRI findings patient was recommended decompression sterilization procedure L3-4 with her as well as of the operation as well as  Course and expectations of outcome alternatives of surgery she understood and agreed to proceed forward.  Operative procedure: Patient was brought to the or was induced in general he's positioned prone the Wilson frame her back was prepped and draped in routine sterile fashion. Her old incision was opened up and extended cephalad subperiosteal dissections care and lamina of L2 and L3 exposed the TPS L3 bilaterally he old hardware was identified and the fusion was taken down the nuts were removed the rods removed effusion does appear to be solid at L4-5 but due to her preoperative CT showing some erosion of the disc space on like a Schmorl's node into the right L4 screw I elected not to  take out the hardware and Aleve the screws and L5 for additional support. After all the knots and rods removed and the screws were left in place and decompression was begun with removal of spinous process L3 centrally lamina decompression was begun bleed medial facetectomies were performed at 34 there was marked stenosis and scar tissue extensively along the L4 pedicle at the L4 nerve root dissected off the pedicle and the spaces identified and the L3 nerve root was significantly unroofed flush with the L2 pedicle to after adequate decompression achieved a second pedicle screw placement L3 using a high-speed drill a pilot hole was drilled The awl probed 55 Her begin a 6 x 45 screw inserted L3 on the right fluoroscopy used at each step along the way to confirm depth and trajectories. The left-sided L3 screws placement is taken the interbody work disc space was incised on the right the free fragment was removed from a medial to the pedicle of L4 the right and disc spaces cleanout size 10 distractor was initially placed followed by a size 12 this had good apposition the endplates of the appropriate sizing for the implant. The work on the left side disc spaces cleanout endplates and scraped a size 12 cutter and chisel used to prep the endplates and a tangent allograft was inserted the patient's left side. In a similar fashion a right-sided disc space was prepared and local a gross packed centrally initially placement of a Telamon without using the chisel was unable to place switch to make attempts to place a tangent graft wedge however due the posterior aspect this did allow placement here as well to debridement 12  chisel and chisel the posterior osteophyte off of the vertebral body and this allowed passage of the peek cage into the interspace on the right. Fluoroscopy confirmed good position of the implants then was going to see her get meticulous hemostasis was maintained aggressive decortication was care MTPs or  lateral gutters the remainder local autograft was packed posterior laterally with small amount magnafuse he's packed is well. The foraminal reinspected to confirm decompression Gelfoam was laid epidural Archuleta as placed posterior fluoroscopy confirmed good position of all screws rods and implants and the wounds closed in layers with Vicryl and skin was closed running 4 subsequent benzoin shift the patient recovered in stable condition at the end of case all but does report that the nurse.

## 2011-05-01 NOTE — Progress Notes (Signed)
Labs, CXR, and EKG completed. EKG taken to [place], [place] for review. Plan of care updated with pt and daughter.

## 2011-05-01 NOTE — H&P (Signed)
Diana Hale is an 78 y.o. female.   Chief Complaint: Back and right greater than left leg pain HPI: This is a significant who underwent previous L4-5 fusion many years ago initially did very well however last several weeks and months has had progressive worsening back bilateral hip and leg pain radiating down her anterior quad dislocation below her knee the front of her shin. She denies any bowel bladder complaints she says her right leg much worse than her left it's been refractory anti-inflammatories and physical therapy as well as oral steroids. Her workup has revealed extensive degenerative disc disease L3-4 with a retrolisthesis L3 on 4 to get a fishmouth into the disc space and marked lateral recess stenosis with a disc bulge causing severe stenosis both 3 and the forward this level with underlying evidence of instability or facet joint. Because his initial conservative treatment and progressive clinical syndrome patient recommended extensor fusion L3-4 with her as well as of the operation with her she understands and agrees to proceed forward.  Past Medical History  Diagnosis Date  . Diabetes mellitus   . Hypothyroidism   . Urinary frequency   . GERD (gastroesophageal reflux disease)   . Diverticulosis   . Arthritis     Past Surgical History  Procedure Date  . Hernia repair 1988    hiatal  . Back surgery   . Knee arthroscopy     bilat  . Shoulder arthroscopy     right  . Tonsillectomy   . Appendectomy   . Cholecystectomy     History reviewed. No pertinent family history. Social History:  reports that she has quit smoking. She does not have any smokeless tobacco history on file. She reports that she does not drink alcohol or use illicit drugs.  Allergies:  Allergies  Allergen Reactions  . Reglan Other (See Comments)    "made her feel wired"    Medications Prior to Admission  Medication Dose Route Frequency Provider Last Rate Last Dose  . bacitracin 45409 UNITS  injection           . ceFAZolin (ANCEF) IVPB 2 g/50 mL premix  2 g Intravenous Once Mariam Dollar, MD      . dexamethasone (DECADRON) injection 10 mg  10 mg Intravenous Once Mariam Dollar, MD      . HYDROmorphone (DILAUDID) injection 0.25-0.5 mg  0.25-0.5 mg Intravenous Q5 min PRN Kerby Nora, MD      . morphine injection 4.54 mg  0.05 mg/kg Intravenous Q10 min PRN Kerby Nora, MD      . mupirocin ointment (BACTROBAN) 2 %        1 application at 05/01/11 1045  . ondansetron (ZOFRAN) injection 4 mg  4 mg Intravenous Once PRN Kerby Nora, MD      . sodium chloride 0.9 % infusion            Medications Prior to Admission  Medication Sig Dispense Refill  . azelastine (ASTEPRO) 137 MCG/SPRAY nasal spray Place 1 spray into the nose 2 (two) times daily. Use in each nostril as directed      . HYDROcodone-acetaminophen (LORTAB) 7.5-500 MG per tablet Take 1 tablet by mouth every 4 (four) hours as needed. For pain      . insulin aspart protamine-insulin aspart (NOVOLOG 70/30) (70-30) 100 UNIT/ML injection Inject 40-60 Units into the skin 2 (two) times daily with a meal. Sliding scale      . levothyroxine (SYNTHROID, LEVOTHROID) 75  MCG tablet Take 75 mcg by mouth daily.      . metFORMIN (GLUCOPHAGE) 500 MG tablet Take 500 mg by mouth 2 (two) times daily with a meal.      . pantoprazole (PROTONIX) 40 MG tablet Take 40 mg by mouth 2 (two) times daily.      . polyethylene glycol (MIRALAX / GLYCOLAX) packet Take 17 g by mouth 2 (two) times daily.      Marland Kitchen tolterodine (DETROL LA) 4 MG 24 hr capsule Take 4 mg by mouth daily.        Results for orders placed during the hospital encounter of 05/01/11 (from the past 48 hour(s))  SURGICAL PCR SCREEN     Status: Normal   Collection Time   05/01/11 10:10 AM      Component Value Range Comment   MRSA, PCR NEGATIVE  NEGATIVE     Staphylococcus aureus NEGATIVE  NEGATIVE    GLUCOSE, CAPILLARY     Status: Abnormal   Collection Time   05/01/11 10:21 AM      Component  Value Range Comment   Glucose-Capillary 112 (*) 70 - 99 (mg/dL)   TYPE AND SCREEN     Status: Normal   Collection Time   05/01/11 10:25 AM      Component Value Range Comment   ABO/RH(D) A POS      Antibody Screen NEG      Sample Expiration 05/04/2011     BASIC METABOLIC PANEL     Status: Abnormal   Collection Time   05/01/11 10:30 AM      Component Value Range Comment   Sodium 139  135 - 145 (mEq/L)    Potassium 4.8  3.5 - 5.1 (mEq/L)    Chloride 104  96 - 112 (mEq/L)    CO2 26  19 - 32 (mEq/L)    Glucose, Bld 122 (*) 70 - 99 (mg/dL)    BUN 14  6 - 23 (mg/dL)    Creatinine, Ser 9.60  0.50 - 1.10 (mg/dL)    Calcium 9.5  8.4 - 10.5 (mg/dL)    GFR calc non Af Amer 68 (*) >90 (mL/min)    GFR calc Af Amer 79 (*) >90 (mL/min)   CBC     Status: Normal   Collection Time   05/01/11 10:30 AM      Component Value Range Comment   WBC 7.7  4.0 - 10.5 (K/uL) WHITE COUNT CONFIRMED ON SMEAR   RBC 4.48  3.87 - 5.11 (MIL/uL)    Hemoglobin 13.0  12.0 - 15.0 (g/dL)    HCT 45.4  09.8 - 11.9 (%)    MCV 87.9  78.0 - 100.0 (fL)    MCH 29.0  26.0 - 34.0 (pg)    MCHC 33.0  30.0 - 36.0 (g/dL)    RDW 14.7  82.9 - 56.2 (%)    Platelets 277  150 - 400 (K/uL)   GLUCOSE, CAPILLARY     Status: Abnormal   Collection Time   05/01/11 10:49 AM      Component Value Range Comment   Glucose-Capillary 118 (*) 70 - 99 (mg/dL)   GLUCOSE, CAPILLARY     Status: Abnormal   Collection Time   05/01/11 12:51 PM      Component Value Range Comment   Glucose-Capillary 121 (*) 70 - 99 (mg/dL)    Dg Chest 2 View  03/15/8655  *RADIOLOGY REPORT*  Clinical Data: Preop.  CHEST - 2 VIEW  Comparison: 06/19/2006.  Findings: Trachea is midline.  Heart size normal.  Calcified granuloma in the right lung.  Scarring at the left lung base. Lungs are otherwise clear.  No pleural fluid.  IMPRESSION: No acute findings.  Original Report Authenticated By: Reyes Ivan, M.D.   Ct Lumbar Spine Wo Contrast  05/01/2011  *RADIOLOGY REPORT*   Clinical Data: Recurrent low back and right leg pain.  History of lumbar fusion 06/20/2006.  CT LUMBAR SPINE WITHOUT CONTRAST  Technique:  Multidetector CT imaging of the lumbar spine was performed without intravenous contrast administration. Multiplanar CT image reconstructions were also generated.  Comparison: Plain films lumbar spine 12/18/2006 and 10/11/2006.  Findings: The patient is status post L4-5 laminectomy and fusion. Solid bridging bone is seen across the disc interspace and about the posterior elements.  0.6 cm of anterolisthesis of L4 on L5 is unchanged.  The patient's hardware is intact without loosening. The right L4 screw is uncovered due to a focal defect in the superior endplate above the screw. The defect has an appearance most compatible with a Schmorl's node or possibly a focal compression fracture.  Hardware is otherwise unremarkable. Visualized paraspinous structures demonstrate atherosclerosis in the descending abdominal aorta without aneurysm.  T12-L1:  Negative.  L1-2:  Negative.  L2-3:  Mild disc bulge with ligamentum flavum thickening and some facet degenerative disease.  Central canal and foramina appear open.  L3-4:  Streak artifact from hardware somewhat limits evaluation of this level.  The patient has a disc bulge and facet degenerative disease.  Ligamentum flavum appears thickened.  There is moderate appearing central canal narrowing.  Mild to moderate foraminal narrowing appears worse on the left.  L4-5:  Status post laminectomy and fusion.  Central canal and foramina are widely patent.  L5-S1:  The patient has a mild disc bulge.  There is facet arthropathy.  Central canal and foramina appear open.  IMPRESSION:  1.  Status post L4-5 fusion.  Fusion appears solid.  Note is again made of a defect in the superior endplate of L4 compatible with either a Schmorl's node or focal compression fracture.  The defect results in uncovering of the right L4 screw. 2.  Spondylosis at L3-4 causes  moderate central canal narrowing and left worse than right foraminal narrowing.  Original Report Authenticated By: Bernadene Bell. Maricela Curet, M.D.    Review of Systems  [name]: Negative.   Eyes: Negative.   Respiratory: Negative.   Cardiovascular: Negative.   Gastrointestinal: Negative.   Genitourinary: Negative.   Musculoskeletal: Positive for myalgias, back pain and joint pain.  Skin: Negative.   Neurological: Positive for tingling.  Endo/Heme/Allergies: Negative.   Psychiatric/Behavioral: Negative.     Blood pressure 134/74, pulse 68, temperature 98.2 F (36.8 C), temperature source Oral, resp. rate 18, height 5\' 7"  (1.702 m), weight 90.719 kg (200 lb), SpO2 96.00%. Physical Exam  Constitutional: She is oriented to person, place, and time. She appears well-developed and well-nourished.  [name]:  Head: Normocephalic and atraumatic.  Eyes: Pupils are equal, round, and reactive to light.  Neck: Normal range of motion. Neck supple.  Cardiovascular: Normal rate.   Respiratory: Breath sounds normal.  GI: Soft. Bowel sounds are normal.  Neurological: She is alert and oriented to person, place, and time. She has normal strength. GCS eye subscore is 4. GCS verbal subscore is 5. GCS motor subscore is 6.  Reflex Scores:      Patellar reflexes are 0 on the right side and 0 on the left side.  Achilles reflexes are 0 on the right side and 0 on the left side.      Strength is 5 out of 5 in her iliopsoas, quads, and she's, gastrocs, anterior tibialis, EHL.     Assessment/Plan Genitalia female presents for L3 PLIF expiration of fusion L4-5. I've essentially gone over the risks and benefits of the operation L3-4 causing perioperative course and expectations of outcome of the surgery they understand and agree to proceed forward. Jamine Highfill P 05/01/2011, 1:34 PM

## 2011-05-02 ENCOUNTER — Encounter (HOSPITAL_COMMUNITY): Payer: Self-pay | Admitting: General Practice

## 2011-05-02 LAB — GLUCOSE, CAPILLARY
Glucose-Capillary: 206 mg/dL — ABNORMAL HIGH (ref 70–99)
Glucose-Capillary: 207 mg/dL — ABNORMAL HIGH (ref 70–99)

## 2011-05-02 MED ORDER — CEFAZOLIN SODIUM 1-5 GM-% IV SOLN
1.0000 g | Freq: Three times a day (TID) | INTRAVENOUS | Status: AC
  Administered 2011-05-02 – 2011-05-04 (×6): 1 g via INTRAVENOUS
  Filled 2011-05-02 (×7): qty 50

## 2011-05-02 MED ORDER — INSULIN ASPART 100 UNIT/ML ~~LOC~~ SOLN
0.0000 [IU] | Freq: Three times a day (TID) | SUBCUTANEOUS | Status: DC
  Administered 2011-05-02 – 2011-05-03 (×3): 4 [IU] via SUBCUTANEOUS
  Administered 2011-05-04: 11 [IU] via SUBCUTANEOUS
  Administered 2011-05-04: 4 [IU] via SUBCUTANEOUS

## 2011-05-02 MED ORDER — INSULIN ASPART 100 UNIT/ML ~~LOC~~ SOLN: 0.0000 [IU] | Freq: Every day | SUBCUTANEOUS | Status: DC

## 2011-05-02 NOTE — Progress Notes (Signed)
UR COMPLETED  

## 2011-05-02 NOTE — Evaluation (Signed)
Physical Therapy Evaluation Patient Details Name: Diana Hale MRN: [id number] DOB: 01-15-1932 Today's Date: 05/02/2011  Problem List: There is no problem list on file for this patient.   Past Medical History:  Past Medical History  Diagnosis Date  . Diabetes mellitus   . Hypothyroidism   . Urinary frequency   . GERD (gastroesophageal reflux disease)   . Diverticulosis   . Arthritis    Past Surgical History:  Past Surgical History  Procedure Date  . Hernia repair 1988    hiatal  . Knee arthroscopy     bilat  . Shoulder arthroscopy     right  . Tonsillectomy   . Appendectomy   . Cholecystectomy   . Back surgery     1 ceviacl and 2 lumbar     PT Assessment/Plan/Recommendation PT Assessment Clinical Impression Statement: Pt is a 78 y.o. female s/p L3-L4, L4-L5 fusion with results pain, decreased mobility, impaired gait, and decreased balance.  Patient will benefit from skilled physical therapy in the acute setting to improve mobility, gait and balance. PT Recommendation/Assessment: Patient will need skilled PT in the acute care venue PT Problem List: Decreased activity tolerance;Decreased balance;Decreased strength;Decreased mobility;Decreased coordination;Decreased safety awareness;Decreased knowledge of precautions;Pain Barriers to Discharge: None PT Therapy Diagnosis : Difficulty walking;Abnormality of gait;Acute pain;Generalized weakness PT Plan PT Frequency: Min 5X/week PT Treatment/Interventions: DME instruction;Gait training;Stair training;Functional mobility training;Therapeutic activities;Balance training;Neuromuscular re-education;Patient/family education PT Recommendation Follow Up Recommendations: Home health PT Equipment Recommended: None recommended by PT PT Goals  Acute Rehab PT Goals PT Goal Formulation: With patient Time For Goal Achievement: 7 days Pt will Roll Supine to Right Side: with modified independence PT Goal: Rolling Supine to Right Side -  Progress: Goal set today Pt will go Supine/Side to Sit: with supervision PT Goal: Supine/Side to Sit - Progress: Goal set today Pt will go Sit to Supine/Side: with modified independence PT Goal: Sit to Supine/Side - Progress: Goal set today Pt will go Sit to Stand: with min assist PT Goal: Sit to Stand - Progress: Goal set today Pt will go Stand to Sit: with min assist PT Goal: Stand to Sit - Progress: Goal set today Pt will Go Up / Down Stairs: 3-5 stairs;with min assist;with least restrictive assistive device;with rail(s) PT Goal: Up/Down Stairs - Progress: Goal set today Additional Goals Additional Goal #1: Pt will be able to describe 3/3 back precautions. PT Goal: Additional Goal #1 - Progress: Goal set today  PT Evaluation Precautions/Restrictions  Precautions Precautions: Back Precaution Comments: Pt instructed in back precautions Required Braces or Orthoses: No Restrictions Weight Bearing Restrictions: No Prior Functioning  Home Living Lives With: Alone Type of Home: Mobile home Home Layout: One level Home Access: Stairs to enter Entrance Stairs-Rails: Right;Left;Can reach both Entrance Stairs-Number of Steps: 8 (shallow and wide steps) Bathroom Shower/Tub: Tub/shower unit;Curtain Bathroom Toilet: Handicapped height Bathroom Accessibility: Yes How Accessible: Accessible via wheelchair Home Adaptive Equipment: Grab bars in shower;Grab bars around toilet;Reacher;Walker - rolling (lifeline) Additional Comments: Pt. going to daughter's home at discharge.  One level home; 4 steps to enter bil. railings; low commode with a sink beside; tub/shower combo with curtain. Prior Function Level of Independence: Independent with basic ADLs;Independent with homemaking with ambulation;Independent with gait;Independent with transfers (used RW occasionally) Able to Take Stairs?: Yes Driving: Yes Vocation: Retired Comments: Pt reports a "series" of falls ~ 6 months ago - fell off a step  stool, stumbled outside, etc. She reports that she has not had any falls since  that time Cognition Cognition Arousal/Alertness: Awake/alert Overall Cognitive Status: Appears within functional limits for tasks assessed Orientation Level: Oriented X4 Sensation/Coordination Sensation Light Touch: Appears Intact Coordination Gross Motor Movements are Fluid and Coordinated: Yes Extremity Assessment   Mobility (including Balance) Bed Mobility Bed Mobility: Yes Rolling Left: 4: Min assist Rolling Left Details (indicate cue type and reason): Patient required min assist to role and VC to maintain back precautions. Left Sidelying to Sit: 3: Mod assist Left Sidelying to Sit Details (indicate cue type and reason): Pt required mod assist secondary to pain and decreased mobility. Sitting - Scoot to Edge of Bed: 5: Supervision Sitting - Scoot to [place] of Bed Details (indicate cue type and reason): Patient required supervision for safety Transfers Transfers: Yes Sit to Stand: 1: +2 Total assist;Patient percentage (comment) Sit to Stand Details (indicate cue type and reason): Pt = 50%. Stand to Sit: 3: Mod assist Stand to Sit Details: Patient required mon assist to control descent into chair. Ambulation/Gait Ambulation/Gait: Yes Ambulation/Gait Assistance: 3: Mod assist Ambulation/Gait Assistance Details (indicate cue type and reason): Patient required mod assist to ambulate secondary to pain and decreased mobility. Ambulation Distance (Feet): 10 Feet Assistive device: Rolling walker Gait Pattern: Step-to pattern;Decreased stride length;Decreased hip/knee flexion - right;Decreased hip/knee flexion - left Gait velocity: Decreased Stairs: No Wheelchair Mobility Wheelchair Mobility: No  Posture/Postural Control Posture/Postural Control: No significant limitations Balance Balance Assessed: No Exercise    End of Session PT - End of Session Equipment Utilized During Treatment: Gait  belt Activity Tolerance: Patient limited by pain Patient left: in chair;with call bell in reach Nurse Communication: Mobility status for transfers;Mobility status for ambulation (Pain level) General Behavior During Session: [facility] for tasks performed Cognition: [facility] for tasks performed  Ezzard Standing SPT 05/02/2011, 1:13 PM

## 2011-05-02 NOTE — Evaluation (Signed)
Agree with student PT evaluation.  Lorea Kupfer, PT DPT [phone]  

## 2011-05-02 NOTE — Progress Notes (Signed)
Contacted diabetes coordinator about insulin Novolog 70/30 dosage. Asked to start with 40 units today and if CBG is not controled with it, dosage can increase to 50 units tomorrow .

## 2011-05-02 NOTE — Evaluation (Signed)
Occupational Therapy Evaluation Patient Details Name: SHARLETTE JANSMA MRN: [id number] DOB: 1931-05-10 Today's Date: 05/02/2011  Problem List: There is no problem list on file for this patient.   Past Medical History:  Past Medical History  Diagnosis Date  . Diabetes mellitus   . Hypothyroidism   . Urinary frequency   . GERD (gastroesophageal reflux disease)   . Diverticulosis   . Arthritis    Past Surgical History:  Past Surgical History  Procedure Date  . Hernia repair 1988    hiatal  . Knee arthroscopy     bilat  . Shoulder arthroscopy     right  . Tonsillectomy   . Appendectomy   . Cholecystectomy   . Back surgery     1 ceviacl and 2 lumbar     OT Assessment/Plan/Recommendation OT Assessment Clinical Impression Statement: This 78 y.o. female admitted for L3-4, 4-5 fusion.  Pt. demonstrates the below listed deficits that impact her ability to perform BADLs at [facility].  Pt. will benefit from OT to maximize safety and independence with BADLs to allow her to return home with dtr. and min a OT Recommendation/Assessment: Patient will need skilled OT in the acute care venue OT Problem List: Decreased strength;Decreased activity tolerance;Impaired balance (sitting and/or standing);Decreased knowledge of use of DME or AE;Decreased knowledge of precautions;Pain;Obesity Barriers to Discharge: None OT Therapy Diagnosis : Generalized weakness;Acute pain OT Plan OT Frequency: Min 2X/week OT Treatment/Interventions: Self-care/ADL training;DME and/or AE instruction;Therapeutic activities;Patient/family education OT Recommendation Follow Up Recommendations: Home health OT;Supervision/Assistance - 24 hour Equipment Recommended: 3 in 1 bedside comode Individuals Consulted Consulted and Agree with Results and Recommendations: Patient OT Goals Acute Rehab OT Goals OT Goal Formulation: With patient Time For Goal Achievement: 7 days ADL Goals Pt Will Perform Grooming: with  supervision;Standing at sink ADL Goal: Grooming - Progress: Goal set today Pt Will Perform Upper Body Bathing: with set-up;Sitting, chair ADL Goal: Upper Body Bathing - Progress: Goal set today Pt Will Perform Lower Body Bathing: with supervision;Sit to stand from chair;Sit to stand from bed (with AE) ADL Goal: Lower Body Bathing - Progress: Goal set today Pt Will Perform Lower Body Dressing: with supervision;with adaptive equipment;Sit to stand from chair;Sit to stand from bed ADL Goal: Lower Body Dressing - Progress: Goal set today Pt Will Transfer to Toilet: with min assist;Ambulation;3-in-1 (min guard assist) ADL Goal: Toilet Transfer - Progress: Goal set today Pt Will Perform Toileting - Clothing Manipulation: with supervision;Standing ADL Goal: Toileting - Clothing Manipulation - Progress: Goal set today Pt Will Perform Toileting - Hygiene: with supervision;with adaptive equipment;Sit to stand from 3-in-1/toilet ADL Goal: Toileting - Hygiene - Progress: Goal set today Pt Will Perform Tub/Shower Transfer: with min assist;Ambulation;Shower seat with back;Transfer tub bench ADL Goal: Web designer - Progress: Goal set today  OT Evaluation Precautions/Restrictions  Precautions Precautions: Back Precaution Comments: Pt instructed in back precautions Required Braces or Orthoses: No Restrictions Weight Bearing Restrictions: No Prior Functioning     ADL ADL Eating/Feeding: Simulated;Independent Where Assessed - Eating/Feeding: Chair Grooming: Simulated;Wash/dry face;Wash/dry hands;Minimal assistance Where Assessed - Grooming: Standing at sink Upper Body Bathing: Simulated;Moderate assistance Where Assessed - Upper Body Bathing: Sitting, chair Lower Body Bathing: Simulated;Moderate assistance Where Assessed - Lower Body Bathing: Sit to stand from chair Upper Body Dressing: Simulated;Minimal assistance Where Assessed - Upper Body Dressing: Supported;Sitting, chair Lower Body  Dressing: Simulated;+1 Total assistance Lower Body Dressing Details (indicate cue type and reason): Pt. unable to access feet by crossing ankles over knees due to  pain.  However, she reports she was able to do this PTA.  May benefit from AE instruction  Where Assessed - Lower Body Dressing: Sit to stand from chair Toilet Transfer: Simulated;Moderate assistance Toilet Transfer Method: Stand pivot Toilet Transfer Equipment: Bedside commode Toileting - Clothing Manipulation: Simulated;Moderate assistance Where Assessed - Toileting Clothing Manipulation: Standing Toileting - Hygiene: Simulated;Moderate assistance Where Assessed - Toileting Hygiene: Standing Equipment Used: Rolling walker Ambulation Related to ADLs: Pt. ambulated short distance with min A ADL Comments: Co-eval with PT.  pt. moving slowly, but motivated.  Pt. limited by fatigue and pain this date. Vision/Perception    Cognition Cognition Arousal/Alertness: Awake/alert Overall Cognitive Status: Appears within functional limits for tasks assessed Orientation Level: Oriented X4 Sensation/Coordination Coordination Gross Motor Movements are Fluid and Coordinated: Yes Fine Motor Movements are Fluid and Coordinated: Yes Extremity Assessment RUE Assessment RUE Assessment: Within Functional Limits LUE Assessment LUE Assessment: Within Functional Limits Mobility  Bed Mobility Bed Mobility: Yes Rolling Left: 4: Min assist;With rail Left Sidelying to Sit: With rails;3: Mod assist;HOB elevated (comment degrees) (25) Sitting - Scoot to Edge of Bed: 5: Supervision Transfers Transfers: Yes Sit to Stand: 1: +2 Total assist;Patient percentage (comment) Sit to Stand Details (indicate cue type and reason): Pt. ~50% Stand to Sit: 3: Mod assist Exercises   End of Session OT - End of Session Activity Tolerance: Patient limited by fatigue;Patient limited by pain Patient left: in chair;with call bell in reach Nurse Communication:  Mobility status for transfers General Behavior During Session: [facility] for tasks performed Cognition: [facility] for tasks performed   Fuller Makin M 05/02/2011, 6:47 PM

## 2011-05-02 NOTE — Progress Notes (Signed)
Subjective: Patient reports She's doing well she's having no leg pain back is sore but well-managed in the post the  Objective: Vital signs in last 24 hours: Temp:  [97 F (36.1 C)-101.5 F (38.6 C)] 101.5 F (38.6 C) (03/19 0625) Pulse Rate:  [64-99] 96  (03/19 0625) Resp:  [18-22] 22  (03/19 0625) BP: (100-142)/(43-74) 114/68 mmHg (03/19 0625) SpO2:  [94 %-98 %] 95 % (03/19 0625)  Intake/Output from previous day: 03/18 0701 - 03/19 0700 In: 2800 [I.V.:2000; Blood:300; IV Piggyback:500] Out: 1095 [Urine:270; Drains:75; Blood:750] Intake/Output this shift:    Strength 5 out of 5 wound clean and dry.  Lab Results:  Basename 05/01/11 1030  WBC 7.7  HGB 13.0  HCT 39.4  PLT 277   BMET  Basename 05/01/11 1030  NA 139  K 4.8  CL 104  CO2 26  GLUCOSE 122*  BUN 14  CREATININE 0.81  CALCIUM 9.5    Studies/Results: Dg Chest 2 View  05/01/2011  *RADIOLOGY REPORT*  Clinical Data: Preop.  CHEST - 2 VIEW  Comparison: 06/19/2006.  Findings: Trachea is midline.  Heart size normal.  Calcified granuloma in the right lung.  Scarring at the left lung base. Lungs are otherwise clear.  No pleural fluid.  IMPRESSION: No acute findings.  Original Report Authenticated By: Reyes Ivan, M.D.   Dg Lumbar Spine 2-3 Views  05/01/2011  *RADIOLOGY REPORT*  Clinical Data: Back pain  LUMBAR SPINE - 2-3 VIEW  Comparison: CT lumbar spine 05/01/2011  Findings: Lumbar fusion has been extended upward to involve L3-L4. No gross adverse features.  IMPRESSION: As above.  L3-L5 fusion.  Original Report Authenticated By: Elsie Stain, M.D.   Ct Lumbar Spine Wo Contrast  05/01/2011  *RADIOLOGY REPORT*  Clinical Data: Recurrent low back and right leg pain.  History of lumbar fusion 06/20/2006.  CT LUMBAR SPINE WITHOUT CONTRAST  Technique:  Multidetector CT imaging of the lumbar spine was performed without intravenous contrast administration. Multiplanar CT image reconstructions were also generated.   Comparison: Plain films lumbar spine 12/18/2006 and 10/11/2006.  Findings: The patient is status post L4-5 laminectomy and fusion. Solid bridging bone is seen across the disc interspace and about the posterior elements.  0.6 cm of anterolisthesis of L4 on L5 is unchanged.  The patient's hardware is intact without loosening. The right L4 screw is uncovered due to a focal defect in the superior endplate above the screw. The defect has an appearance most compatible with a Schmorl's node or possibly a focal compression fracture.  Hardware is otherwise unremarkable. Visualized paraspinous structures demonstrate atherosclerosis in the descending abdominal aorta without aneurysm.  T12-L1:  Negative.  L1-2:  Negative.  L2-3:  Mild disc bulge with ligamentum flavum thickening and some facet degenerative disease.  Central canal and foramina appear open.  L3-4:  Streak artifact from hardware somewhat limits evaluation of this level.  The patient has a disc bulge and facet degenerative disease.  Ligamentum flavum appears thickened.  There is moderate appearing central canal narrowing.  Mild to moderate foraminal narrowing appears worse on the left.  L4-5:  Status post laminectomy and fusion.  Central canal and foramina are widely patent.  L5-S1:  The patient has a mild disc bulge.  There is facet arthropathy.  Central canal and foramina appear open.  IMPRESSION:  1.  Status post L4-5 fusion.  Fusion appears solid.  Note is again made of a defect in the superior endplate of L4 compatible with either a Schmorl's  node or focal compression fracture.  The defect results in uncovering of the right L4 screw. 2.  Spondylosis at L3-4 causes moderate central canal narrowing and left worse than right foraminal narrowing.  Original Report Authenticated By: Bernadene Bell. Maricela Curet, M.D.    Assessment/Plan: Postoperative day 1 from L3-4 fusion doing very well mobilized today with physical therapy and since from her for low-grade fever  LOS:  1 day     Kesi Perrow P 05/02/2011, 8:07 AM

## 2011-05-03 LAB — GLUCOSE, CAPILLARY: Glucose-Capillary: 115 mg/dL — ABNORMAL HIGH (ref 70–99)

## 2011-05-03 NOTE — Progress Notes (Signed)
Physical Therapy Treatment Patient Details Name: Diana Hale MRN: [id number] DOB: Sep 03, 1931 Today's Date: 05/03/2011  PT Assessment/Plan  PT - Assessment/Plan Comments on Treatment Session: Pt progressing with PT goals.  Increased ambulation distance today.  MD in to assess pt after pt ambulated in hall & states pt may be able to d/c home tomorrow if continues to progress.  Encouraged pt to ambulate with Nsing 2-3x's more today.  Pt will need to perform steps before d/cing home.   PT Frequency: Min 5X/week Follow Up Recommendations: Home health PT Equipment Recommended: None recommended by PT PT Goals  Acute Rehab PT Goals PT Goal: Sit to Stand - Progress: Progressing toward goal PT Goal: Stand to Sit - Progress: Progressing toward goal Pt will Ambulate: 51 - 150 feet;with least restrictive assistive device;with supervision PT Goal: Ambulate - Progress: Goal set today  PT Treatment Precautions/Restrictions  Precautions Precautions: Back Precaution Comments: Pt instructed in back precautions Required Braces or Orthoses: No Restrictions Weight Bearing Restrictions: No Mobility (including Balance) Bed Mobility Bed Mobility: No Transfers Sit to Stand: 4: Min assist;From chair/3-in-1;From bed;With upper extremity assist;With armrests Sit to Stand Details (indicate cue type and reason): (A) to achieve standing, balance, & safety.  Cues for hand placement & technqiue.   Stand to Sit: Other (comment);To bed;To chair/3-in-1;With upper extremity assist;With armrests (Min Guard (A)) Stand to Sit Details: Cues for hand placement & use of UE's to control descent.   Ambulation/Gait Ambulation/Gait Assistance: Other (comment) (Min Guard (A)) Ambulation/Gait Assistance Details (indicate cue type and reason): Pt ambulates at slow pace with small steps.  Encouragement to increase step/stride length.  Encouragemetn to increase distance.  Pt c/o fatigue/"weakness".  Ambulation Distance (Feet):  50 Feet Assistive device: Rolling walker Gait Pattern: Step-through pattern;Decreased stride length;Decreased hip/knee flexion - left;Decreased hip/knee flexion - right Stairs: No Wheelchair Mobility Wheelchair Mobility: No  Posture/Postural Control Posture/Postural Control: No significant limitations Balance Balance Assessed: No Exercise    End of Session PT - End of Session Equipment Utilized During Treatment: Gait belt Activity Tolerance: Patient tolerated treatment well Patient left: in chair;with call bell in reach;with family/visitor present Nurse Communication: Mobility status for transfers;Mobility status for ambulation General Behavior During Session: [facility] for tasks performed Cognition: [facility] for tasks performed  Lara Mulch 05/03/2011, 4:03 PM [phone]

## 2011-05-03 NOTE — Progress Notes (Signed)
Agree with PTA goal update.  [place],  DPT [phone]

## 2011-05-03 NOTE — Progress Notes (Signed)
   CARE MANAGEMENT NOTE 05/03/2011  Patient:  Diana Hale, Diana Hale   Account Number:  [id number]  Date Initiated:  05/03/2011  Documentation initiated by:  Johny Shock  Subjective/Objective Assessment:   Order for [facility] and DME     Action/Plan:   Per PT recommendation pt would benefit from HHPT will also need 3:1 commode.   Anticipated DC Date:  05/04/2011   Anticipated DC Plan:  HOME W HOME HEALTH SERVICES         [facility] Choice  DURABLE MEDICAL EQUIPMENT  HOME HEALTH   Choice offered to / List presented to:  C-1 Patient   DME arranged  3-N-1      DME agency  [facility]     HH arranged  HH-2 PT      [facility] agency  [facility]   Status of service:  Completed, signed off Medicare Important Message given?   (If response is "NO", the following Medicare IM given date fields will be blank) Date Medicare IM given:   Date Additional Medicare IM given:    Discharge Disposition:  HOME W HOME HEALTH SERVICES  Per UR Regulation:    If discussed at Long Length of Stay Meetings, dates discussed:    Comments:  05/03/2011 Per pt she will d/c to home of daughter in [place], [place], [facility] notified of this plan. Johny Shock RN MPH Case Manager [phone]

## 2011-05-03 NOTE — Progress Notes (Signed)
Subjective: Patient reports She feels great no leg pain she name building well pain is well controlled she voiding spontaneously  Objective: Vital signs in last 24 hours: Temp:  [97.9 F (36.6 C)-99.6 F (37.6 C)] 97.9 F (36.6 C) (03/20 1023) Pulse Rate:  [86-102] 86  (03/20 1023) Resp:  [18-20] 18  (03/20 1023) BP: (105-117)/(56-67) 117/56 mmHg (03/20 1023) SpO2:  [79 %-96 %] 95 % (03/20 1023) Weight:  [90.719 kg (200 lb)] 90.719 kg (200 lb) (03/19 1300)  Intake/Output from previous day: 03/19 0701 - 03/20 0700 In: 236 [P.O.:236] Out: 1025 [Urine:950; Drains:75] Intake/Output this shift: Total I/O In: 236 [P.O.:236] Out: 100 [Drains:100]  Strength out of 5 wound clean and dry  Lab Results:  Basename 05/01/11 1030  WBC 7.7  HGB 13.0  HCT 39.4  PLT 277   BMET  Basename 05/01/11 1030  NA 139  K 4.8  CL 104  CO2 26  GLUCOSE 122*  BUN 14  CREATININE 0.81  CALCIUM 9.5    Studies/Results: Dg Lumbar Spine 2-3 Views  05/01/2011  *RADIOLOGY REPORT*  Clinical Data: Back pain  LUMBAR SPINE - 2-3 VIEW  Comparison: CT lumbar spine 05/01/2011  Findings: Lumbar fusion has been extended upward to involve L3-L4. No gross adverse features.  IMPRESSION: As above.  L3-L5 fusion.  Original Report Authenticated By: Elsie Stain, M.D.    Assessment/Plan: Postop day 2 from L3-4 plus patient recovered well continue mobilizes possible discharge tomorrow  LOS: 2 days     Lesle Faron P 05/03/2011, 11:03 AM

## 2011-05-04 LAB — GLUCOSE, CAPILLARY: Glucose-Capillary: 174 mg/dL — ABNORMAL HIGH (ref 70–99)

## 2011-05-04 MED ORDER — CYCLOBENZAPRINE HCL 10 MG PO TABS: 10.0000 mg | ORAL_TABLET | Freq: Three times a day (TID) | ORAL | Status: AC | PRN

## 2011-05-04 MED ORDER — OXYCODONE-ACETAMINOPHEN 10-325 MG PO TABS: 1.0000 | ORAL_TABLET | ORAL | Status: AC | PRN

## 2011-05-04 NOTE — Progress Notes (Signed)
Physical Therapy Treatment Patient Details Name: Diana Hale MRN: [id number] DOB: Feb 19, 1931 Today's Date: 05/04/2011  PT Assessment/Plan  PT - Assessment/Plan Comments on Treatment Session: Pt. has shown progress this session. Uncontrolled urination upon initial stand, total (A) for hygeine care. Pt. requiring verbal cues for safe hand placement with sit><stand today. Ambulated from room to 3000 gym with minimal cuing and no rest break until reaching gym. Initiated stair training today, pt. performed well. Plans to go home to daughters house today.  PT Frequency: Min 5X/week Follow Up Recommendations: Home health PT Equipment Recommended: None recommended by PT PT Goals  Acute Rehab PT Goals PT Goal Formulation: With patient Time For Goal Achievement: 7 days PT Goal: Supine/Side to Sit - Progress: Met PT Goal: Sit to Stand - Progress: Met PT Goal: Stand to Sit - Progress: Met PT Goal: Ambulate - Progress: Progressing toward goal PT Goal: Up/Down Stairs - Progress: Progressing toward goal  PT Treatment Precautions/Restrictions  Precautions Precautions: Back Precaution Comments: Pt instructed in back precautions Required Braces or Orthoses: No Restrictions Weight Bearing Restrictions: No Mobility (including Balance) Bed Mobility Bed Mobility: Yes Supine to Sit: 5: Supervision;HOB elevated (Comment degrees) (30 degrees) Supine to Sit Details (indicate cue type and reason): patient able to come to EOB without manual (A). required increased time. Moderate use of rail. Sitting - Scoot to Edge of Bed: 5: Supervision Sitting - Scoot to [facility] of Bed Details (indicate cue type and reason): verbal cues to scoot hips forward on bed. Transfers Transfers: Yes Sit to Stand: 4: Min assist;From bed;From chair/3-in-1;With upper extremity assist;With armrests Sit to Stand Details (indicate cue type and reason): Required Min (A) for standing. sit><stand x 5, verbal cues each time for technique  and safe hand placement.  Stand to Sit: 4: Min assist;With armrests;To elevated surface;To chair/3-in-1;To toilet;Other (comment);With upper extremity assist (mat table) Stand to Sit Details: verbal cues to reach back for surface/armrest. Min (A) for control of descent.  Ambulation/Gait Ambulation/Gait: Yes Ambulation/Gait Assistance:  (Min guard (A)) Ambulation/Gait Assistance Details (indicate cue type and reason): SPTA kept hand on gait belt for safety, pt. stated she felt "a little wobbly" but no physical assistance needed. Minimal cues to increase step length.  Pt. demonstrated good use of walker and keeing head up. Ambulated to and from 3000 gym to practice stairs, did not require rest break until reaching gym and one rest break before walking back to room. Ambulation Distance (Feet): 250 Feet Assistive device: Rolling walker Gait Pattern: Step-through pattern;Decreased stride length;Decreased hip/knee flexion - right;Decreased hip/knee flexion - left Gait velocity: Decreased Stairs: Yes Stairs Assistance: 4: Min assist Stairs Assistance Details (indicate cue type and reason): assistance to move RW to the side. Pt. is going home to daughter house which has 4 steps with rails to enter. Educated patient on forward technique with rails, patient able to perform safely with cueing for moving RW to side of stairs. Pt. states she feels comfortable with stairs and reports she will be in the house for atleast 3-4 days once she gets there.  Stair Management Technique: Two rails;Step to pattern;Forwards;With walker;Other (comment) (RW moved to side, not used on stairs) Number of Stairs: 2 (3 trials)  End of Session PT - End of Session Equipment Utilized During Treatment: Gait belt Activity Tolerance: Patient tolerated treatment well Patient left: in chair;with call bell in reach General Behavior During Session: [facility] for tasks performed Cognition: [facility] for tasks performed  Ardyth Gal  SPTA 05/04/2011, 10:02 AM  Verdell Face, [place] [phone] 05/04/2011

## 2011-05-04 NOTE — Progress Notes (Signed)
Subjective: Patient reports Doing fairly well to a fair amount back pain is well controlled on the medicine no leg pain voiding spontaneously  Objective: Vital signs in last 24 hours: Temp:  [97.9 F (36.6 C)-98.8 F (37.1 C)] 98.3 F (36.8 C) (03/21 0623) Pulse Rate:  [80-100] 80  (03/21 0623) Resp:  [17-20] 20  (03/21 0623) BP: (94-141)/(54-78) 141/78 mmHg (03/21 0623) SpO2:  [88 %-100 %] 100 % (03/21 0623)  Intake/Output from previous day: 03/20 0701 - 03/21 0700 In: 708 [P.O.:708] Out: 100 [Drains:100] Intake/Output this shift:    Strength 5 out of 5 wound clean and dry  Lab Results:  Basename 05/01/11 1030  WBC 7.7  HGB 13.0  HCT 39.4  PLT 277   BMET  Basename 05/01/11 1030  NA 139  K 4.8  CL 104  CO2 26  GLUCOSE 122*  BUN 14  CREATININE 0.81  CALCIUM 9.5    Studies/Results: No results found.  Assessment/Plan: Posterior day 3 from aPLIF pleasant stable for discharge scheduled followup 1-2 weeks  LOS: 3 days     Kamylah Manzo P 05/04/2011, 8:21 AM

## 2011-05-04 NOTE — Discharge Instructions (Signed)
No lifting no bending no twisting no driving °

## 2011-05-04 NOTE — Progress Notes (Signed)
OT Cancellation Note  Treatment cancelled today due to patient's refusal to participate. Pt reports that she is hurting too much to participate in therapy this afternoon. Will re-attempt tomorrow.   05/04/2011 Cipriano Mile OTR/L Pager [phone] Office [phone]

## 2011-05-04 NOTE — Discharge Summary (Signed)
  Physician Discharge Summary  Patient ID: Diana Hale MRN: [id number] DOB/AGE: 1931-11-22 78 y.o.  Admit date: 05/01/2011 Discharge date: 05/04/2011  Admission Diagnoses: Ruptured disc L3-4 degenerative disc disease and lumbar instability  Discharge Diagnoses: Same Active Problems:  * No active hospital problems. *    Discharged Condition: good  Hospital Course: Patient is metatarsal and 1 L3-4 plus expiration of fusion L4-5 postoperatively patient did very well went to recovery room and the floor for this, as well as family and voiding spontaneously tolerating a diet pain was well controlled on pills by postop day 3 it was still be discharged home to  Consults: Significant Diagnostic Studies: Treatments: L3-4 posterior lumbar interbody fusion Discharge Exam: Blood pressure 141/78, pulse 80, temperature 98.3 F (36.8 C), temperature source Oral, resp. rate 20, height 5\' 7"  (1.702 m), weight 90.719 kg (200 lb), SpO2 100.00%. Strength 5 out of 5 wound clean dry  Disposition: Home   Medication List  As of 05/04/2011  8:23 AM   TAKE these medications         ASTEPRO 137 MCG/SPRAY nasal spray   Generic drug: azelastine   Place 1 spray into the nose 2 (two) times daily. Use in each nostril as directed      cyclobenzaprine 10 MG tablet   Commonly known as: FLEXERIL   Take 1 tablet (10 mg total) by mouth 3 (three) times daily as needed for muscle spasms.      DETROL LA 4 MG 24 hr capsule   Generic drug: tolterodine   Take 4 mg by mouth daily.      HYDROcodone-acetaminophen 7.5-500 MG per tablet   Commonly known as: LORTAB   Take 1 tablet by mouth every 4 (four) hours as needed. For pain      insulin aspart protamine-insulin aspart (70-30) 100 UNIT/ML injection   Commonly known as: NOVOLOG 70/30   Inject 40-60 Units into the skin 2 (two) times daily with a meal. Sliding scale      levothyroxine 75 MCG tablet   Commonly known as: SYNTHROID, LEVOTHROID   Take 75 mcg by  mouth daily.      metFORMIN 500 MG tablet   Commonly known as: GLUCOPHAGE   Take 500 mg by mouth 2 (two) times daily with a meal.      oxyCODONE-acetaminophen 10-325 MG per tablet   Commonly known as: PERCOCET   Take 1 tablet by mouth every 4 (four) hours as needed for pain.      pantoprazole 40 MG tablet   Commonly known as: PROTONIX   Take 40 mg by mouth 2 (two) times daily.      polyethylene glycol packet   Commonly known as: MIRALAX / GLYCOLAX   Take 17 g by mouth 2 (two) times daily.             Signed: Johannah Rozas P 05/04/2011, 8:23 AM

## 2011-05-05 LAB — GLUCOSE, CAPILLARY: Glucose-Capillary: 159 mg/dL — ABNORMAL HIGH (ref 70–99)

## 2011-05-05 MED FILL — Hydromorphone HCl Inj 1 MG/ML: INTRAMUSCULAR | Qty: 1 | Status: AC

## 2011-05-08 MED FILL — Sodium Chloride IV Soln 0.9%: INTRAVENOUS | Qty: 1000 | Status: AC

## 2011-05-08 MED FILL — Sodium Chloride Irrigation Soln 0.9%: Qty: 3000 | Status: AC

## 2011-05-08 MED FILL — Heparin Sodium (Porcine) Inj 1000 Unit/ML: INTRAMUSCULAR | Qty: 30 | Status: AC

## 2011-05-09 ENCOUNTER — Ambulatory Visit (HOSPITAL_COMMUNITY)
Admission: RE | Admit: 2011-05-09 | Discharge: 2011-05-09 | Disposition: A | Discharge status: Home / Self Care | Payer: Medicare Other | Source: Ambulatory Visit | Attending: Neurosurgery | Admitting: Neurosurgery

## 2011-05-09 DIAGNOSIS — M79609 Pain in unspecified limb: Secondary | ICD-10-CM | POA: Insufficient documentation

## 2011-05-09 DIAGNOSIS — R52 Pain, unspecified: Secondary | ICD-10-CM

## 2011-05-09 NOTE — Progress Notes (Signed)
Left lower extremity venous duplex completed at 13:30.  Preliminary report is negative for DVT, SVT, or a Baker's cyst in the left leg.  Negative for DVT in the right common femoral vein.

## 2012-01-03 ENCOUNTER — Ambulatory Visit: Payer: Self-pay | Admitting: Internal Medicine

## 2012-01-05 ENCOUNTER — Ambulatory Visit: Payer: Self-pay | Admitting: Internal Medicine

## 2012-04-16 ENCOUNTER — Ambulatory Visit: Payer: Self-pay | Admitting: Ophthalmology

## 2012-04-22 ENCOUNTER — Ambulatory Visit: Payer: Self-pay | Admitting: Internal Medicine

## 2012-04-24 ENCOUNTER — Ambulatory Visit: Payer: Self-pay | Admitting: Ophthalmology

## 2012-06-18 ENCOUNTER — Ambulatory Visit: Payer: Self-pay | Admitting: Physical Medicine and Rehabilitation

## 2013-01-06 ENCOUNTER — Ambulatory Visit: Payer: Self-pay | Admitting: Internal Medicine

## 2013-03-25 ENCOUNTER — Ambulatory Visit: Payer: Self-pay | Admitting: Specialist

## 2013-03-26 LAB — BASIC METABOLIC PANEL
ANION GAP: 6 — AB (ref 7–16)
BUN: 18 mg/dL (ref 7–18)
CHLORIDE: 104 mmol/L (ref 98–107)
CO2: 27 mmol/L (ref 21–32)
Calcium, Total: 10.2 mg/dL — ABNORMAL HIGH (ref 8.5–10.1)
Creatinine: 0.88 mg/dL (ref 0.60–1.30)
EGFR (African American): >60
EGFR (Non-African Amer.): >60
Glucose: 162 mg/dL — ABNORMAL HIGH (ref 65–99)
OSMOLALITY: 279 (ref 275–301)
POTASSIUM: 4.9 mmol/L (ref 3.5–5.1)
SODIUM: 137 mmol/L (ref 136–145)

## 2013-04-04 ENCOUNTER — Ambulatory Visit: Payer: Self-pay | Admitting: Specialist

## 2013-09-25 ENCOUNTER — Ambulatory Visit: Payer: Self-pay | Admitting: Physical Medicine and Rehabilitation

## 2014-06-05 NOTE — Op Note (Signed)
PATIENT NAME:  Diana Hale, Diana Hale MR#:  [id number] DATE OF BIRTH:  11/30/1931  DATE OF PROCEDURE:  04/24/2012  PREOPERATIVE DIAGNOSIS:  Senile cataract, left eye.  POSTOPERATIVE DIAGNOSIS:  Senile cataract, left eye.  PROCEDURE:  Phacoemulsification with posterior chamber intraocular lens implantation of the left eye.  LENS:  ZCBOO 20.5-diopter posterior chamber intraocular lens.  ULTRASOUND TIME:  15% of 1 minute, 10 seconds.  CDE 10.8.  SURGEON:  Mali Brasington, MD  ANESTHESIA:  Topical with tetracaine drops and 2% Xylocaine jelly.  COMPLICATIONS:  None.  DESCRIPTION OF PROCEDURE:  The patient was identified in the holding room and transported to the operating room and placed in the supine position under the operating microscope.  The left eye was identified as the operative eye and it was prepped and draped in the usual sterile ophthalmic fashion.  A 1 millimeter clear-corneal paracentesis was made at the 1:30 position.  The anterior chamber was filled with Viscoat viscoelastic.  A 2.4 millimeter keratome was used to make a near-clear corneal incision at the 10:30 position.  A curvilinear capsulorrhexis was made with a cystotome and capsulorrhexis forceps.  Balanced salt solution was used to hydrodissect and hydrodelineate the nucleus.  Phacoemulsification was then used in stop and chop fashion to remove the lens nucleus and epinucleus.  The remaining cortex was then removed using the irrigation and aspiration handpiece. Provisc was then placed into the capsular bag to distend it for lens placement.  A ZCBOO 20.5-diopter lens was then injected into the capsular bag.  The remaining viscoelastic was aspirated. Wounds were hydrated with balanced salt solution.  The anterior chamber was inflated to a physiologic pressure with balanced salt solution.  0.1 mL of cefuroxime 10 mg/mL were injected into the anterior chamber for a dose of 1 mg of intracameral antibiotic at the completion of the case.  Miostat was placed into the anterior chamber to constrict the pupil.  No wound leaks were noted.  Topical Vigamox drops and Maxitrol ointment were applied to the eye.  The patient was taken to the recovery room in stable condition without complications of anesthesia or surgery.   ____________________________ Wyonia Hough, MD crb:cb D: 04/24/2012 15:31:02 ET T: 04/24/2012 16:35:21 ET JOB#: [id number]  cc: Wyonia Hough, MD, <Dictator> Leandrew Koyanagi MD ELECTRONICALLY SIGNED 05/01/2012 12:47

## 2014-06-06 NOTE — Op Note (Signed)
PATIENT NAME:  Diana Hale, Diana Hale MR#:  [id number] DATE OF BIRTH:  08/14/1931  DATE OF PROCEDURE:  04/04/2013  PREOPERATIVE DIAGNOSIS: Left carpal tunnel syndrome.  POSTOPERATIVE DIAGNOSIS: Left carpal tunnel syndrome.   PROCEDURE: Left carpal tunnel release.   SURGEON: Christophe Louis, M.D.   ANESTHESIA: General.   COMPLICATIONS: None.   TOURNIQUET TIME: 15 minutes.   DESCRIPTION OF PROCEDURE: After adequate induction of general anesthesia, the left upper extremity is thoroughly prepped with alcohol and ChloraPrep and draped in standard sterile fashion. The extremity is wrapped out with the Esmarch bandage and pneumatic tourniquet elevated to 250 mmHg. Under loupe magnification, standard volar carpal tunnel incision is made and the dissection carried down to the transverse retinacular ligament. This is incised in the midportion using the knife. The distal release is performed with the small scissors. The proximal release is performed with the small scissors and the carpal tunnel scissors. There is seen to be moderate compression of the nerve directly beneath the ligament. No mass lesion is seen. The wound is thoroughly irrigated multiple times. Skin edges are infiltrated with 0.5% plain Marcaine. The skin is closed with 4-0 nylon. A soft bulky dressing is applied. The tourniquet is released. The patient is returned to the recovery room in satisfactory condition having tolerated the procedure quite well.    ____________________________ Lucas Mallow, MD ces:aw D: 04/04/2013 08:02:59 ET T: 04/04/2013 08:13:40 ET JOB#: [id number]  cc: Lucas Mallow, MD, <Dictator> Lucas Mallow MD ELECTRONICALLY SIGNED 04/07/2013 16:40

## 2014-08-26 ENCOUNTER — Encounter
Admission: RE | Admit: 2014-08-26 | Discharge: 2014-08-26 | Disposition: A | Discharge status: Home / Self Care | Payer: Medicare Other | Source: Ambulatory Visit | Attending: Orthopedic Surgery | Admitting: Orthopedic Surgery

## 2014-08-26 DIAGNOSIS — Z79899 Other long term (current) drug therapy: Secondary | ICD-10-CM | POA: Diagnosis not present

## 2014-08-26 DIAGNOSIS — E875 Hyperkalemia: Secondary | ICD-10-CM | POA: Diagnosis not present

## 2014-08-26 DIAGNOSIS — Z01810 Encounter for preprocedural cardiovascular examination: Secondary | ICD-10-CM | POA: Diagnosis not present

## 2014-08-26 DIAGNOSIS — E039 Hypothyroidism, unspecified: Secondary | ICD-10-CM | POA: Diagnosis not present

## 2014-08-26 DIAGNOSIS — M1611 Unilateral primary osteoarthritis, right hip: Secondary | ICD-10-CM | POA: Insufficient documentation

## 2014-08-26 DIAGNOSIS — E119 Type 2 diabetes mellitus without complications: Secondary | ICD-10-CM | POA: Diagnosis not present

## 2014-08-26 DIAGNOSIS — Z794 Long term (current) use of insulin: Secondary | ICD-10-CM | POA: Diagnosis not present

## 2014-08-26 DIAGNOSIS — M8580 Other specified disorders of bone density and structure, unspecified site: Secondary | ICD-10-CM | POA: Insufficient documentation

## 2014-08-26 DIAGNOSIS — M5417 Radiculopathy, lumbosacral region: Secondary | ICD-10-CM | POA: Diagnosis not present

## 2014-08-26 DIAGNOSIS — Z01812 Encounter for preprocedural laboratory examination: Secondary | ICD-10-CM | POA: Insufficient documentation

## 2014-08-26 DIAGNOSIS — M25519 Pain in unspecified shoulder: Secondary | ICD-10-CM | POA: Insufficient documentation

## 2014-08-26 DIAGNOSIS — K219 Gastro-esophageal reflux disease without esophagitis: Secondary | ICD-10-CM | POA: Diagnosis not present

## 2014-08-26 DIAGNOSIS — I10 Essential (primary) hypertension: Secondary | ICD-10-CM | POA: Insufficient documentation

## 2014-08-26 DIAGNOSIS — M79604 Pain in right leg: Secondary | ICD-10-CM | POA: Diagnosis present

## 2014-08-26 DIAGNOSIS — E785 Hyperlipidemia, unspecified: Secondary | ICD-10-CM | POA: Diagnosis not present

## 2014-08-26 HISTORY — DX: Cardiac murmur, unspecified: R01.1

## 2014-08-26 HISTORY — DX: Malignant (primary) neoplasm, unspecified: C80.1

## 2014-08-26 LAB — URINALYSIS COMPLETE WITH MICROSCOPIC (ARMC ONLY)
Bilirubin Urine: NEGATIVE
Glucose, UA: NEGATIVE mg/dL
Hgb urine dipstick: NEGATIVE
KETONES UR: NEGATIVE mg/dL
Nitrite: POSITIVE — AB
PROTEIN: NEGATIVE mg/dL
Specific Gravity, Urine: 1.021 (ref 1.005–1.030)
pH: 6 (ref 5.0–8.0)

## 2014-08-26 LAB — CBC
HCT: 34.7 % — ABNORMAL LOW (ref 35.0–47.0)
Hemoglobin: 10.8 g/dL — ABNORMAL LOW (ref 12.0–16.0)
MCH: 26.6 pg (ref 26.0–34.0)
MCHC: 31.1 g/dL — AB (ref 32.0–36.0)
MCV: 85.5 fL (ref 80.0–100.0)
Platelets: 259 10*3/uL (ref 150–440)
RBC: 4.06 MIL/uL (ref 3.80–5.20)
RDW: 14.7 % — AB (ref 11.5–14.5)
WBC: 8.7 10*3/uL (ref 3.6–11.0)

## 2014-08-26 LAB — SEDIMENTATION RATE: SED RATE: 14 mm/h (ref 0–30)

## 2014-08-26 LAB — PROTIME-INR
INR: 0.94
Prothrombin Time: 12.8 seconds (ref 11.4–15.0)

## 2014-08-26 LAB — BASIC METABOLIC PANEL
Anion gap: 7 (ref 5–15)
BUN: 17 mg/dL (ref 6–20)
CO2: 28 mmol/L (ref 22–32)
Calcium: 8.8 mg/dL — ABNORMAL LOW (ref 8.9–10.3)
Chloride: 105 mmol/L (ref 101–111)
Creatinine, Ser: 0.97 mg/dL (ref 0.44–1.00)
GFR calc Af Amer: >60 mL/min (ref >60)
GFR calc non Af Amer: 54 mL/min — ABNORMAL LOW (ref >60)
GLUCOSE: 171 mg/dL — AB (ref 65–99)
POTASSIUM: 4.7 mmol/L (ref 3.5–5.1)
SODIUM: 140 mmol/L (ref 135–145)

## 2014-08-26 LAB — APTT: aPTT: 27 seconds (ref 24–36)

## 2014-08-26 LAB — SURGICAL PCR SCREEN
MRSA, PCR: NEGATIVE
STAPHYLOCOCCUS AUREUS: NEGATIVE

## 2014-08-26 LAB — ABO/RH: ABO/RH(D): A POS

## 2014-08-26 LAB — TYPE AND SCREEN
ABO/RH(D): A POS
Antibody Screen: NEGATIVE

## 2014-08-26 NOTE — Patient Instructions (Signed)
  Your procedure is scheduled on: Thursday 09/03/2014 Report to Day Surgery. 2ND FLOOR MEDICAL MALL ENTRANCE To find out your arrival time please call [phone] between 1PM - 3PM on Wednesday 09/02/2014.  Remember: Instructions that are not followed completely may result in serious medical risk, up to and including death, or upon the discretion of your surgeon and anesthesiologist your surgery may need to be rescheduled.    __X__ 1. Do not eat food or drink liquids after midnight. No gum chewing or hard candies.     __X__ 2. No Alcohol for 24 hours before or after surgery.   ____ 3. Bring all medications with you on the day of surgery if instructed.    __X__ 4. Notify your doctor if there is any change in your medical condition     (cold, fever, infections).     Do not wear jewelry, make-up, hairpins, clips or nail polish.  Do not wear lotions, powders, or perfumes. You may wear deodorant.  Do not shave 48 hours prior to surgery. Men may shave face and neck.  Do not bring valuables to the hospital.    [facility] is not responsible for any belongings or valuables.               Contacts, dentures or bridgework may not be worn into surgery.  Leave your suitcase in the car. After surgery it may be brought to your room.  For patients admitted to the hospital, discharge time is determined by your                treatment team.   Patients discharged the day of surgery will not be allowed to drive home.   Please read over the following fact sheets that you were given:   MRSA Information and Surgical Site Infection Prevention   __X__ Take these medicines the morning of surgery with A SIP OF WATER:    1. PROTONIX  2. LEVOTHYROXINE  3. HYDROCODONE  4.  5.  6.  ____ Fleet Enema (as directed)   __X_ Use CHG Soap as directed  ____ Use inhalers on the day of surgery  __X__ Stop metformin 2 days prior to surgery    ____ Take 1/2 of usual insulin dose the night before surgery and  none on the morning of surgery. AS WE DISCUSSED  ____ Stop Coumadin/Plavix/aspirin on   __X__ Stop Anti-inflammatories on TODAY   __X__ Stop supplements until after surgery.  STOP VITAMIN B12 TODAY  ____ Bring C-Pap to the hospital.

## 2014-09-03 ENCOUNTER — Encounter
Admission: RE | Disposition: A | Discharge status: Skilled Nursing Facility | Payer: Self-pay | Source: Ambulatory Visit | Attending: Orthopedic Surgery

## 2014-09-03 ENCOUNTER — Ambulatory Visit: Payer: Medicare Other | Admitting: Anesthesiology

## 2014-09-03 ENCOUNTER — Inpatient Hospital Stay: Payer: Medicare Other

## 2014-09-03 ENCOUNTER — Ambulatory Visit: Payer: Medicare Other

## 2014-09-03 ENCOUNTER — Inpatient Hospital Stay
Admission: RE | Admit: 2014-09-03 | Discharge: 2014-09-06 | DRG: 470 | Disposition: A | Discharge status: Skilled Nursing Facility | Payer: Medicare Other | Source: Ambulatory Visit | Attending: Orthopedic Surgery | Admitting: Orthopedic Surgery

## 2014-09-03 DIAGNOSIS — Z79891 Long term (current) use of opiate analgesic: Secondary | ICD-10-CM | POA: Diagnosis not present

## 2014-09-03 DIAGNOSIS — Z8543 Personal history of malignant neoplasm of ovary: Secondary | ICD-10-CM | POA: Diagnosis not present

## 2014-09-03 DIAGNOSIS — E039 Hypothyroidism, unspecified: Secondary | ICD-10-CM | POA: Diagnosis present

## 2014-09-03 DIAGNOSIS — K5790 Diverticulosis of intestine, part unspecified, without perforation or abscess without bleeding: Secondary | ICD-10-CM | POA: Diagnosis present

## 2014-09-03 DIAGNOSIS — M8580 Other specified disorders of bone density and structure, unspecified site: Secondary | ICD-10-CM | POA: Diagnosis present

## 2014-09-03 DIAGNOSIS — M5417 Radiculopathy, lumbosacral region: Secondary | ICD-10-CM | POA: Diagnosis present

## 2014-09-03 DIAGNOSIS — M1611 Unilateral primary osteoarthritis, right hip: Secondary | ICD-10-CM | POA: Diagnosis present

## 2014-09-03 DIAGNOSIS — Z885 Allergy status to narcotic agent status: Secondary | ICD-10-CM

## 2014-09-03 DIAGNOSIS — E559 Vitamin D deficiency, unspecified: Secondary | ICD-10-CM | POA: Diagnosis present

## 2014-09-03 DIAGNOSIS — R350 Frequency of micturition: Secondary | ICD-10-CM | POA: Diagnosis present

## 2014-09-03 DIAGNOSIS — Z794 Long term (current) use of insulin: Secondary | ICD-10-CM

## 2014-09-03 DIAGNOSIS — I10 Essential (primary) hypertension: Secondary | ICD-10-CM | POA: Diagnosis present

## 2014-09-03 DIAGNOSIS — Z9109 Other allergy status, other than to drugs and biological substances: Secondary | ICD-10-CM | POA: Diagnosis not present

## 2014-09-03 DIAGNOSIS — M25512 Pain in left shoulder: Secondary | ICD-10-CM | POA: Diagnosis present

## 2014-09-03 DIAGNOSIS — E785 Hyperlipidemia, unspecified: Secondary | ICD-10-CM | POA: Diagnosis present

## 2014-09-03 DIAGNOSIS — Z419 Encounter for procedure for purposes other than remedying health state, unspecified: Secondary | ICD-10-CM

## 2014-09-03 DIAGNOSIS — Z87891 Personal history of nicotine dependence: Secondary | ICD-10-CM | POA: Diagnosis not present

## 2014-09-03 DIAGNOSIS — E119 Type 2 diabetes mellitus without complications: Secondary | ICD-10-CM | POA: Diagnosis present

## 2014-09-03 DIAGNOSIS — R011 Cardiac murmur, unspecified: Secondary | ICD-10-CM | POA: Diagnosis present

## 2014-09-03 DIAGNOSIS — K219 Gastro-esophageal reflux disease without esophagitis: Secondary | ICD-10-CM | POA: Diagnosis present

## 2014-09-03 DIAGNOSIS — Z79899 Other long term (current) drug therapy: Secondary | ICD-10-CM

## 2014-09-03 DIAGNOSIS — G8918 Other acute postprocedural pain: Secondary | ICD-10-CM

## 2014-09-03 HISTORY — PX: TOTAL HIP ARTHROPLASTY: SHX124

## 2014-09-03 LAB — CBC
HEMATOCRIT: 33.3 % — AB (ref 35.0–47.0)
HEMOGLOBIN: 10.6 g/dL — AB (ref 12.0–16.0)
MCH: 26.5 pg (ref 26.0–34.0)
MCHC: 31.8 g/dL — ABNORMAL LOW (ref 32.0–36.0)
MCV: 83.5 fL (ref 80.0–100.0)
PLATELETS: 257 10*3/uL (ref 150–440)
RBC: 3.99 MIL/uL (ref 3.80–5.20)
RDW: 14.2 % (ref 11.5–14.5)
WBC: 14.4 10*3/uL — AB (ref 3.6–11.0)

## 2014-09-03 LAB — CREATININE, SERUM
Creatinine, Ser: 0.94 mg/dL (ref 0.44–1.00)
GFR calc Af Amer: >60 mL/min (ref >60)
GFR, EST NON AFRICAN AMERICAN: 56 mL/min — AB (ref >60)

## 2014-09-03 LAB — GLUCOSE, CAPILLARY
GLUCOSE-CAPILLARY: 134 mg/dL — AB (ref 65–99)
GLUCOSE-CAPILLARY: 104 mg/dL — AB (ref 65–99)
Glucose-Capillary: 136 mg/dL — ABNORMAL HIGH (ref 65–99)
Glucose-Capillary: 167 mg/dL — ABNORMAL HIGH (ref 65–99)

## 2014-09-03 SURGERY — ARTHROPLASTY, HIP, TOTAL, ANTERIOR APPROACH
Anesthesia: Monitor Anesthesia Care | Laterality: Right

## 2014-09-03 MED ORDER — NEOMYCIN-POLYMYXIN B GU 40-200000 IR SOLN
Status: AC
  Filled 2014-09-03: qty 4

## 2014-09-03 MED ORDER — HYDROMORPHONE HCL 1 MG/ML IJ SOLN
0.5000 mg | Freq: Once | INTRAMUSCULAR | Status: AC
  Administered 2014-09-03: 0.5 mg via INTRAVENOUS
  Filled 2014-09-03: qty 1

## 2014-09-03 MED ORDER — PHENOL 1.4 % MT LIQD
1.0000 | OROMUCOSAL | Status: DC | PRN
  Filled 2014-09-03: qty 177

## 2014-09-03 MED ORDER — ALUM & MAG HYDROXIDE-SIMETH 200-200-20 MG/5ML PO SUSP: 30.0000 mL | ORAL | Status: DC | PRN

## 2014-09-03 MED ORDER — FENTANYL CITRATE (PF) 100 MCG/2ML IJ SOLN
INTRAMUSCULAR | Status: DC | PRN
  Administered 2014-09-03 (×2): 50 ug via INTRAVENOUS

## 2014-09-03 MED ORDER — PHENYLEPHRINE HCL 10 MG/ML IJ SOLN
INTRAMUSCULAR | Status: DC | PRN
  Administered 2014-09-03: 100 ug via INTRAVENOUS

## 2014-09-03 MED ORDER — LEVOTHYROXINE SODIUM 100 MCG PO TABS
100.0000 ug | ORAL_TABLET | Freq: Every day | ORAL | Status: DC
  Administered 2014-09-04 – 2014-09-06 (×2): 100 ug via ORAL
  Filled 2014-09-03 (×2): qty 1

## 2014-09-03 MED ORDER — ENOXAPARIN SODIUM 40 MG/0.4ML ~~LOC~~ SOLN
40.0000 mg | SUBCUTANEOUS | Status: DC
  Administered 2014-09-04 – 2014-09-06 (×3): 40 mg via SUBCUTANEOUS
  Filled 2014-09-03 (×3): qty 0.4

## 2014-09-03 MED ORDER — VITAMIN D 1000 UNITS PO TABS
2000.0000 [IU] | ORAL_TABLET | Freq: Every day | ORAL | Status: DC
  Administered 2014-09-03 – 2014-09-06 (×4): 2000 [IU] via ORAL
  Filled 2014-09-03 (×4): qty 2

## 2014-09-03 MED ORDER — METHOCARBAMOL 500 MG PO TABS
750.0000 mg | ORAL_TABLET | Freq: Four times a day (QID) | ORAL | Status: DC | PRN
  Administered 2014-09-03 – 2014-09-06 (×8): 750 mg via ORAL
  Filled 2014-09-03 (×8): qty 2

## 2014-09-03 MED ORDER — PANTOPRAZOLE SODIUM 40 MG PO TBEC
40.0000 mg | DELAYED_RELEASE_TABLET | Freq: Two times a day (BID) | ORAL | Status: DC
  Administered 2014-09-03 – 2014-09-06 (×7): 40 mg via ORAL
  Filled 2014-09-03 (×7): qty 1

## 2014-09-03 MED ORDER — INSULIN ASPART PROT & ASPART (70-30 MIX) 100 UNIT/ML ~~LOC~~ SUSP
30.0000 [IU] | Freq: Two times a day (BID) | SUBCUTANEOUS | Status: DC
  Administered 2014-09-03 – 2014-09-06 (×6): 30 [IU] via SUBCUTANEOUS
  Filled 2014-09-03 (×6): qty 30

## 2014-09-03 MED ORDER — ONDANSETRON HCL 4 MG PO TABS: 4.0000 mg | ORAL_TABLET | Freq: Four times a day (QID) | ORAL | Status: DC | PRN

## 2014-09-03 MED ORDER — LACTATED RINGERS IV SOLN
INTRAVENOUS | Status: DC | PRN
Start: 2014-09-03 — End: 2014-09-03
  Administered 2014-09-03: 12:00:00 via INTRAVENOUS

## 2014-09-03 MED ORDER — BISACODYL 10 MG RE SUPP
10.0000 mg | Freq: Every day | RECTAL | Status: DC | PRN
  Administered 2014-09-05: 10 mg via RECTAL
  Filled 2014-09-03: qty 1

## 2014-09-03 MED ORDER — ONDANSETRON HCL 4 MG/2ML IJ SOLN
4.0000 mg | Freq: Four times a day (QID) | INTRAMUSCULAR | Status: DC | PRN
  Administered 2014-09-03: 4 mg via INTRAVENOUS
  Filled 2014-09-03: qty 2

## 2014-09-03 MED ORDER — MAGNESIUM CITRATE PO SOLN: 1.0000 | Freq: Once | ORAL | Status: AC | PRN

## 2014-09-03 MED ORDER — LIDOCAINE HCL (PF) 1 % IJ SOLN
INTRAMUSCULAR | Status: AC
  Administered 2014-09-03: 0.2 mL
  Filled 2014-09-03: qty 2

## 2014-09-03 MED ORDER — ACETAMINOPHEN 325 MG PO TABS: 650.0000 mg | ORAL_TABLET | Freq: Four times a day (QID) | ORAL | Status: DC | PRN

## 2014-09-03 MED ORDER — PROPOFOL INFUSION 10 MG/ML OPTIME
INTRAVENOUS | Status: DC | PRN
  Administered 2014-09-03: 50 ug/kg/min via INTRAVENOUS

## 2014-09-03 MED ORDER — AZELASTINE HCL 0.1 % NA SOLN
1.0000 | Freq: Two times a day (BID) | NASAL | Status: DC
  Administered 2014-09-03 – 2014-09-05 (×4): 1 via NASAL
  Filled 2014-09-03 (×2): qty 30

## 2014-09-03 MED ORDER — TRANEXAMIC ACID 1000 MG/10ML IV SOLN
1000.0000 mg | INTRAVENOUS | Status: AC
  Administered 2014-09-03: 1000 mg via INTRAVENOUS
  Filled 2014-09-03: qty 10

## 2014-09-03 MED ORDER — DOCUSATE SODIUM 100 MG PO CAPS
100.0000 mg | ORAL_CAPSULE | Freq: Two times a day (BID) | ORAL | Status: DC
  Administered 2014-09-03 – 2014-09-06 (×7): 100 mg via ORAL
  Filled 2014-09-03 (×7): qty 1

## 2014-09-03 MED ORDER — BUPIVACAINE-EPINEPHRINE (PF) 0.25% -1:200000 IJ SOLN
INTRAMUSCULAR | Status: AC
  Filled 2014-09-03: qty 30

## 2014-09-03 MED ORDER — SODIUM CHLORIDE 0.9 % IV SOLN
INTRAVENOUS | Status: DC
  Administered 2014-09-03 – 2014-09-04 (×3): via INTRAVENOUS

## 2014-09-03 MED ORDER — CYANOCOBALAMIN 500 MCG PO TABS
2500.0000 ug | ORAL_TABLET | Freq: Every day | ORAL | Status: DC
  Administered 2014-09-03 – 2014-09-06 (×4): 2500 ug via ORAL
  Filled 2014-09-03 (×4): qty 5

## 2014-09-03 MED ORDER — ACETAMINOPHEN 650 MG RE SUPP: 650.0000 mg | Freq: Four times a day (QID) | RECTAL | Status: DC | PRN

## 2014-09-03 MED ORDER — SODIUM CHLORIDE 0.9 % IV SOLN
INTRAVENOUS | Status: DC
  Administered 2014-09-03: 11:00:00 via INTRAVENOUS

## 2014-09-03 MED ORDER — MENTHOL 3 MG MT LOZG
1.0000 | LOZENGE | OROMUCOSAL | Status: DC | PRN
  Filled 2014-09-03 (×2): qty 9

## 2014-09-03 MED ORDER — MORPHINE SULFATE ER 15 MG PO TBCR
15.0000 mg | EXTENDED_RELEASE_TABLET | Freq: Two times a day (BID) | ORAL | Status: DC
  Administered 2014-09-03 – 2014-09-04 (×2): 15 mg via ORAL
  Filled 2014-09-03 (×2): qty 1

## 2014-09-03 MED ORDER — INSULIN ASPART PROT & ASPART (70-30 MIX) 100 UNIT/ML ~~LOC~~ SUSP: 35.0000 [IU] | Freq: Two times a day (BID) | SUBCUTANEOUS | Status: DC

## 2014-09-03 MED ORDER — CEFAZOLIN SODIUM-DEXTROSE 2-3 GM-% IV SOLR: 2.0000 g | Freq: Once | INTRAVENOUS | Status: DC

## 2014-09-03 MED ORDER — MIDAZOLAM HCL 2 MG/2ML IJ SOLN
INTRAMUSCULAR | Status: DC | PRN
  Administered 2014-09-03: 2 mg via INTRAVENOUS

## 2014-09-03 MED ORDER — MORPHINE SULFATE 2 MG/ML IJ SOLN
2.0000 mg | INTRAMUSCULAR | Status: DC | PRN
  Administered 2014-09-03 – 2014-09-04 (×10): 2 mg via INTRAVENOUS
  Filled 2014-09-03 (×10): qty 1

## 2014-09-03 MED ORDER — POLYETHYLENE GLYCOL 3350 17 G PO PACK
17.0000 g | PACK | Freq: Two times a day (BID) | ORAL | Status: DC
  Administered 2014-09-03 – 2014-09-06 (×6): 17 g via ORAL
  Filled 2014-09-03 (×6): qty 1

## 2014-09-03 MED ORDER — MAGNESIUM HYDROXIDE 400 MG/5ML PO SUSP
30.0000 mL | Freq: Every day | ORAL | Status: DC | PRN
  Administered 2014-09-05: 30 mL via ORAL
  Filled 2014-09-03: qty 30

## 2014-09-03 MED ORDER — HYDROCODONE-ACETAMINOPHEN 10-325 MG PO TABS
1.0000 | ORAL_TABLET | ORAL | Status: DC | PRN
  Administered 2014-09-03 (×3): 1 via ORAL
  Administered 2014-09-04 – 2014-09-06 (×12): 2 via ORAL
  Filled 2014-09-03 (×2): qty 2
  Filled 2014-09-03: qty 1
  Filled 2014-09-03: qty 2
  Filled 2014-09-03 (×2): qty 1
  Filled 2014-09-03 (×9): qty 2

## 2014-09-03 MED ORDER — INSULIN ASPART 100 UNIT/ML ~~LOC~~ SOLN
0.0000 [IU] | Freq: Three times a day (TID) | SUBCUTANEOUS | Status: DC
  Administered 2014-09-03 – 2014-09-04 (×2): 3 [IU] via SUBCUTANEOUS
  Administered 2014-09-04 (×2): 2 [IU] via SUBCUTANEOUS
  Administered 2014-09-05: 3 [IU] via SUBCUTANEOUS
  Administered 2014-09-05 (×2): 2 [IU] via SUBCUTANEOUS
  Administered 2014-09-06: 3 [IU] via SUBCUTANEOUS
  Filled 2014-09-03: qty 3
  Filled 2014-09-03: qty 2
  Filled 2014-09-03: qty 3
  Filled 2014-09-03 (×2): qty 2
  Filled 2014-09-03 (×2): qty 3
  Filled 2014-09-03: qty 2

## 2014-09-03 MED ORDER — CEFAZOLIN SODIUM-DEXTROSE 2-3 GM-% IV SOLR
2.0000 g | Freq: Four times a day (QID) | INTRAVENOUS | Status: AC
  Administered 2014-09-03 – 2014-09-04 (×3): 2 g via INTRAVENOUS
  Filled 2014-09-03 (×3): qty 50

## 2014-09-03 MED ORDER — CEFAZOLIN SODIUM-DEXTROSE 2-3 GM-% IV SOLR
INTRAVENOUS | Status: AC
  Administered 2014-09-03: 2 g via INTRAVENOUS
  Filled 2014-09-03: qty 50

## 2014-09-03 SURGICAL SUPPLY — 46 items
BLADE SAW 1/2 (BLADE) ×3 IMPLANT
BNDG COHESIVE 6X5 TAN STRL LF (GAUZE/BANDAGES/DRESSINGS) ×6 IMPLANT
CANISTER SUCT 1200ML W/VALVE (MISCELLANEOUS) ×3 IMPLANT
CAPT HIP TOTAL 3 ×2 IMPLANT
CATH FOL LEG HOLDER (MISCELLANEOUS) ×3 IMPLANT
CATH TRAY 16F METER LATEX (MISCELLANEOUS) ×3 IMPLANT
CHLORAPREP W/TINT 26ML (MISCELLANEOUS) ×3 IMPLANT
DECANTER SPIKE VIAL GLASS SM (MISCELLANEOUS) ×2 IMPLANT
DRAPE C-ARM XRAY 36X54 (DRAPES) ×3 IMPLANT
DRAPE INCISE IOBAN 66X60 STRL (DRAPES) IMPLANT
DRAPE POUCH INSTRU U-SHP 10X18 (DRAPES) ×3 IMPLANT
DRAPE SHEET LG 3/4 BI-LAMINATE (DRAPES) ×9 IMPLANT
DRAPE TABLE BACK 80X90 (DRAPES) ×3 IMPLANT
ELECT BLADE 6.5 EXT (BLADE) ×3 IMPLANT
GAUZE SPONGE 4X4 12PLY STRL (GAUZE/BANDAGES/DRESSINGS) ×3 IMPLANT
GLOVE BIOGEL PI IND STRL 9 (GLOVE) ×1 IMPLANT
GLOVE BIOGEL PI INDICATOR 9 (GLOVE) ×2
GLOVE SURG ORTHO 9.0 STRL STRW (GLOVE) ×3 IMPLANT
GOWN SPECIALTY ULTRA XL (MISCELLANEOUS) ×3 IMPLANT
GOWN STRL REUS W/ TWL LRG LVL3 (GOWN DISPOSABLE) ×1 IMPLANT
GOWN STRL REUS W/TWL LRG LVL3 (GOWN DISPOSABLE) ×3
HEMOVAC 400CC 10FR (MISCELLANEOUS) ×3 IMPLANT
HIP FEM HD S 28 (Head) IMPLANT
HOOD PEEL AWAY FACE SHEILD DIS (HOOD) ×3 IMPLANT
MAT BLUE FLOOR 46X72 FLO (MISCELLANEOUS) ×3 IMPLANT
NDL SAFETY 18GX1.5 (NEEDLE) ×3 IMPLANT
NDL SPNL 18GX3.5 QUINCKE PK (NEEDLE) ×1 IMPLANT
NEEDLE SPNL 18GX3.5 QUINCKE PK (NEEDLE) ×3 IMPLANT
NS IRRIG 1000ML POUR BTL (IV SOLUTION) ×3 IMPLANT
PACK HIP COMPR (MISCELLANEOUS) ×3 IMPLANT
SOL PREP PVP 2OZ (MISCELLANEOUS) ×3
SOLUTION PREP PVP 2OZ (MISCELLANEOUS) ×1 IMPLANT
STAPLER SKIN PROX 35W (STAPLE) ×3 IMPLANT
STRAP SAFETY BODY (MISCELLANEOUS) ×3 IMPLANT
SUT DVC 2 QUILL PDO  T11 36X36 (SUTURE) ×2
SUT DVC 2 QUILL PDO T11 36X36 (SUTURE) ×1 IMPLANT
SUT DVC QUILL MONODERM 30X30 (SUTURE) ×3 IMPLANT
SUT ETHIBOND NAB CT1 #1 30IN (SUTURE) ×3 IMPLANT
SUT SILK 0 (SUTURE) ×3
SUT SILK 0 30XBRD TIE 6 (SUTURE) ×1 IMPLANT
SUT VIC AB 1 CT1 36 (SUTURE) ×3 IMPLANT
SYR 20CC LL (SYRINGE) ×3 IMPLANT
SYR 30ML LL (SYRINGE) ×3 IMPLANT
TAPE MICROFOAM 4IN (TAPE) ×3 IMPLANT
TUBE KAMVAC SUCTION (TUBING) ×3 IMPLANT
WATER STERILE IRR 1000ML POUR (IV SOLUTION) ×3 IMPLANT

## 2014-09-03 NOTE — Op Note (Signed)
09/03/2014  1:35 PM  PATIENT:  Diana Hale  81 y.o. female  PRE-OPERATIVE DIAGNOSIS:  OA  POST-OPERATIVE DIAGNOSIS:  osteoarthritis right hip  PROCEDURE:  Procedure(s): TOTAL HIP ARTHROPLASTY ANTERIOR APPROACH (Right)  SURGEON: Laurene Footman, MD  ASSISTANTS: none  ANESTHESIA:   spinal  EBL:  Total I/O In: 1000 [I.V.:1000] Out: 600 [Urine:300; Blood:300]  BLOOD ADMINISTERED:none  DRAINS: none   LOCAL MEDICATIONS USED:  MARCAINE     SPECIMEN:  Source of Specimen:  Femoral head  DISPOSITION OF SPECIMEN:  PATHOLOGY  COUNTS:  YES  TOURNIQUET:  * No tourniquets in log *  IMPLANTS: Medacta AMIS 3 standard stem, 54 mm Mpact DM cup, liner and S 28 mm head  DICTATION: .Dragon Dictation  The patient was brought to the operating room and after spinal anesthesia was obtained patient was placed on the operative table with the ipsilateral foot into the Medacta attachment, contralateral leg on a well-padded table. C-arm was brought in and preop template x-ray taken. After prepping and draping in usual sterile fashion appropriate patient identification and timeout procedures were completed. Anterior approach to the hip was obtained and centered over the greater trochanter and TFL muscle. The subcutaneous tissue was incised hemostasis being achieved by electrocautery. TFL fascia was incised and the muscle retracted laterally deep retractor placed. The lateral femoral circumflex vessels were identified and ligated. The anterior capsule was exposed and a capsulotomy performed. The neck was identified and a femoral neck cut carried out with a saw. The head was removed without difficulty and showed sclerotic femoral head and acetabulum. Reaming was carried out to 54 mm and a 54 mm cup trial gave appropriate tightness to the acetabular component a 54 cup was impacted into position. The leg was then externally rotated and ischiofemoral and patellofemoral releases carried out. The femur was  sequentially broached to a size 3, size 3 stem standard neck and S head trials were placed and the final components chosen. The 3 stem was inserted along with a S 28 mm head and 54 mm liner. The hip was reduced and was stable the wound was thoroughly irrigated with a dilute Betadine solution. The deep fascia view. Using a heavy Quill after infiltration of 30 cc of quarter percent Sensorcaine with epinephrine. 2-0 Quill to close the skin with skin staples Xeroform 4 x 4's ABDs and tape patient was sent to recovery in stable condition complications none specimen removed femoral head issue comes stable.   PLAN OF CARE: Admit to inpatient

## 2014-09-03 NOTE — Transfer of Care (Signed)
Immediate Anesthesia Transfer of Care Note  Patient: Diana Hale  Procedure(s) Performed: Procedure(s): TOTAL HIP ARTHROPLASTY ANTERIOR APPROACH (Right)  Patient Location: PACU  Anesthesia Type:General  Level of Consciousness: Alert, Awake, Oriented  Airway & Oxygen Therapy: Patient Spontanous Breathing  Post-op Assessment: Report given to RN  Post vital signs: Reviewed and stable  Last Vitals:  Filed Vitals:   09/03/14 1334  BP: 164/64  Pulse: 77  Temp: 36.7 C  Resp: 18    Complications: No apparent anesthesia complications

## 2014-09-03 NOTE — Progress Notes (Addendum)
Pts. Pain 10/10 and she's crying. Dilaudid 0.5mg  IV once. May repeat after 5 minutes. MS Contin 15mg  BID per Dr. Rudene Christians

## 2014-09-03 NOTE — Progress Notes (Signed)
30 NOVOLOG MIX BID PER DR. MENZ.

## 2014-09-03 NOTE — Anesthesia Preprocedure Evaluation (Signed)
Anesthesia Evaluation  Patient identified by MRN, date of birth, ID band Patient awake    Reviewed: Allergy & Precautions, H&P , NPO status , Patient's Chart, lab work & pertinent test results, reviewed documented beta blocker date and time   Airway Mallampati: II  TM Distance: >3 FB Neck ROM: limited    Dental  (+) Upper Dentures, Lower Dentures, Missing, Poor Dentition   Pulmonary former smoker,  breath sounds clear to auscultation  Pulmonary exam normal       Cardiovascular Normal cardiovascular exam+ Valvular Problems/Murmurs Rhythm:regular Rate:Normal     Neuro/Psych negative neurological ROS  negative psych ROS   GI/Hepatic Neg liver ROS, GERD-  Controlled,  Endo/Other  diabetesHypothyroidism   Renal/GU negative Renal ROS  negative genitourinary   Musculoskeletal   Abdominal   Peds  Hematology negative hematology ROS (+)   Anesthesia Other Findings Past Medical History:   Diabetes mellitus                                            Hypothyroidism                                               Urinary frequency                                            GERD (gastroesophageal reflux disease)                       Diverticulosis                                               Arthritis                                                    Heart murmur                                                 Cancer                                                         Comment:ovarian   Reproductive/Obstetrics negative OB ROS                             Anesthesia Physical Anesthesia Plan  ASA: III  Anesthesia Plan: Spinal and MAC   Post-op Pain Management:    Induction:   Airway Management Planned:   Additional Equipment:   Intra-op Plan:   Post-operative Plan:  Informed Consent: I have reviewed the patients History and Physical, chart, labs and discussed the procedure including  the risks, benefits and alternatives for the proposed anesthesia with the patient or authorized representative who has indicated his/her understanding and acceptance.   Dental Advisory Given  Plan Discussed with: Anesthesiologist, CRNA and Surgeon  Anesthesia Plan Comments:         Anesthesia Quick Evaluation

## 2014-09-03 NOTE — Progress Notes (Signed)
Pt repositioned and offered muscle relaxant again.

## 2014-09-03 NOTE — Progress Notes (Signed)
Robaxin 750mg  PO QID PRN for muscle spasms per Dr. Rudene Christians.

## 2014-09-03 NOTE — H&P (Signed)
Reviewed paper H+P, will be scanned into chart. No changes noted.  

## 2014-09-03 NOTE — Progress Notes (Signed)
Pt screaming out that she has muscle spasms but refusing prn pain medication ordered by MD stating it was ineffective. Morphine and vicodin give for pain per pt request.

## 2014-09-03 NOTE — Care Management Note (Addendum)
Case Management Note  Patient Details  Name: Diana Hale MRN: [id number] Date of Birth: 12-14-1931  Subjective/Objective:                  Met with patient who was just received from PACU and PT to start in AM. Patient would like to return home with her daughter at discharge. She uses [facility] for Rx  [phone].She states she has a front wheeled walker at home.  Action/Plan: List of home health agencies left with patient to review. [facility] 96m #[facility]. RNCM will continue to follow.     Expected Discharge Date:                  Expected Discharge Plan:     In-House Referral:     Discharge planning Services  CM Consult  Post Acute Care Choice:  Home Health Choice offered to:  Patient  DME Arranged:  N/A DME Agency:  NA  HH Arranged:  PT HH Agency:     Status of Service:  In process, will continue to follow  Medicare Important Message Given:    Date Medicare IM Given:    Medicare IM give by:    Date Additional Medicare IM Given:    Additional Medicare Important Message give by:     If discussed at [place] Meet[place], dates discussed:    Additional Comments: [facility] due to pain. I have notified Tim/Jerry with [facility] text and fax. Lovenox $5. AMarshell Garfinkel RN 09/03/2014, 3:01 PM

## 2014-09-03 NOTE — Anesthesia Postprocedure Evaluation (Signed)
  Anesthesia Post-op Note  Patient: Diana Hale  Procedure(s) Performed: Procedure(s): TOTAL HIP ARTHROPLASTY ANTERIOR APPROACH (Right)  Anesthesia type:Spinal, MAC  Patient location: PACU  Post pain: Pain level controlled  Post assessment: Post-op Vital signs reviewed, Patient's Cardiovascular Status Stable, Respiratory Function Stable, Patent Airway and No signs of Nausea or vomiting  Post vital signs: Reviewed and stable  Last Vitals:  Filed Vitals:   09/03/14 1455  BP: 162/84  Pulse:   Temp:   Resp:     Level of consciousness: awake, alert  and patient cooperative  Complications: No apparent anesthesia complications

## 2014-09-03 NOTE — Anesthesia Procedure Notes (Signed)
Spinal Patient location during procedure: OR Start time: 09/03/2014 11:49 AM End time: 09/03/2014 11:53 AM Reason for block: at surgeon's request Staffing Resident/CRNA: HARVEY, DUSTIN Performed by: resident/CRNA  Preanesthetic Checklist Completed: patient identified, site marked, surgical consent, pre-op evaluation, timeout performed, IV checked, risks and benefits discussed, monitors and equipment checked and at surgeon's request Spinal Block Patient position: sitting Prep: Betadine Patient monitoring: heart rate, cardiac monitor, continuous pulse ox and blood pressure Approach: midline Location: L4-5 Injection technique: single-shot Needle Needle type: Quincke  Needle gauge: 25 G Assessment Sensory level: T10

## 2014-09-04 ENCOUNTER — Encounter
Admission: RE | Admit: 2014-09-04 | Discharge: 2014-09-04 | Disposition: A | Discharge status: Home / Self Care | Payer: Medicare Other | Source: Ambulatory Visit | Attending: Internal Medicine | Admitting: Internal Medicine

## 2014-09-04 LAB — BASIC METABOLIC PANEL
Anion gap: 8 (ref 5–15)
BUN: 9 mg/dL (ref 6–20)
CO2: 26 mmol/L (ref 22–32)
CREATININE: 0.91 mg/dL (ref 0.44–1.00)
Calcium: 8.7 mg/dL — ABNORMAL LOW (ref 8.9–10.3)
Chloride: 103 mmol/L (ref 101–111)
GFR calc Af Amer: >60 mL/min (ref >60)
GFR calc non Af Amer: 58 mL/min — ABNORMAL LOW (ref >60)
GLUCOSE: 144 mg/dL — AB (ref 65–99)
POTASSIUM: 4.4 mmol/L (ref 3.5–5.1)
Sodium: 137 mmol/L (ref 135–145)

## 2014-09-04 LAB — GLUCOSE, CAPILLARY
GLUCOSE-CAPILLARY: 126 mg/dL — AB (ref 65–99)
Glucose-Capillary: 146 mg/dL — ABNORMAL HIGH (ref 65–99)
Glucose-Capillary: 156 mg/dL — ABNORMAL HIGH (ref 65–99)
Glucose-Capillary: 129 mg/dL — ABNORMAL HIGH (ref 65–99)

## 2014-09-04 MED ORDER — DIAZEPAM 5 MG/ML IJ SOLN
2.0000 mg | INTRAMUSCULAR | Status: DC | PRN
  Administered 2014-09-04 (×2): 2 mg via INTRAVENOUS
  Filled 2014-09-04 (×2): qty 2

## 2014-09-04 MED ORDER — MORPHINE SULFATE ER 30 MG PO TBCR
30.0000 mg | EXTENDED_RELEASE_TABLET | Freq: Two times a day (BID) | ORAL | Status: DC
  Administered 2014-09-04 – 2014-09-06 (×5): 30 mg via ORAL
  Filled 2014-09-04 (×5): qty 1

## 2014-09-04 MED ORDER — DIAZEPAM 2 MG PO TABS: 2.0000 mg | ORAL_TABLET | Freq: Four times a day (QID) | ORAL | Status: DC | PRN

## 2014-09-04 NOTE — Evaluation (Addendum)
Occupational Therapy Evaluation Patient Details Name: Diana Hale MRN: [id number] DOB: 08/30/1931 Today's Date: 09/04/2014    History of Present Illness This patient is an 81 year old female who came to [facility] for a R THR (anterior approach).   Clinical Impression   This patient is an 81 year old  who came to [facility] for a R total hip replacement (anterior approach).  Patient lives in a one story mobile home with 7 steps to enter.  She had been independent with ADL and functional mobility. She now requires  assistance and would benefit from Occupational Therapy for ADL/functional mobility training while .staying within hip precautions (anterior approach).      Follow Up Recommendations  SNF    Equipment Recommendations       Recommendations for Other Services       Precautions / Restrictions Precautions Precautions: Anterior Hip;Fall Restrictions Weight Bearing Restrictions: Yes (WBAT)      Mobility Bed Mobility            Balance                             ADL                                         General ADL Comments: Had been independent including driving, now pain limited. Reviewed with patient precautions (anterior approach) Reviewed hip kit and how they work. Taught patient that with anterior approach hip kit is not required for lower body dressing.     Vision     Perception     Praxis      Pertinent Vitals/Pain      Hand Dominance     Extremity/Trunk Assessment Upper Extremity Assessment Upper Extremity Assessment:  (B UE 4+/5)   Lower Extremity Assessment Lower Extremity Assessment: Defer to PT evaluation       Communication Communication Communication: No difficulties   Cognition Arousal/Alertness: Awake/alert Behavior During Therapy: WFL for tasks assessed/performed Overall Cognitive Status: Within Functional Limits for tasks assessed                     General  Comments       Exercises   Other Exercises Other Exercises: Supine bilat ankle pumps/quad sets/AA heel slides/abduction, 1 x 10. Attempted SLR but was pain limiting. (55mn)   Shoulder Instructions      Home Living Family/patient expects to be discharged to:: Private residence Living Arrangements: Alone Available Help at Discharge: Friend(s);Neighbor (Landscape architect Type of Home: Mobile home Home Access: Stairs to enter ETechnical brewerof Steps: 7 Entrance Stairs-Rails: Can reach both Home Layout: One level     Bathroom Shower/Tub: TTeacher, early years/pre Handicapped height Bathroom Accessibility: Yes              Prior Functioning/Environment Level of Independence: Independent             OT Diagnosis: Acute pain   OT Problem List: Decreased activity tolerance   OT Treatment/Interventions: Self-care/ADL training    OT Goals(Current goals can be found in the care plan section) Acute Rehab OT Goals Patient Stated Goal: Less pain OT Goal Formulation: With patient Time For Goal Achievement: 09/18/14  OT Frequency: Min 1X/week   Barriers to D/C:  Co-evaluation              End of Session Equipment Utilized During Treatment:  (hip kit)  Activity Tolerance: Patient limited by pain Patient left: in bed;with call bell/phone within reach;with bed alarm set   Time: 1445-1505 OT Time Calculation (min): 20 min Charges:  OT General Charges $OT Visit: 1 Procedure OT Evaluation $Initial OT Evaluation Tier I: 1 Procedure OT Treatments $Self Care/Home Management : 8-22 mins G-Codes:    Myrene Galas, MS/OTR/L  09/04/2014, 3:24 PM

## 2014-09-04 NOTE — Progress Notes (Signed)
PT Cancellation Note  Patient Details Name: Diana Hale MRN: [id number] DOB: [birth year]   Cancelled Treatment:    Reason Eval/Treat Not Completed: Pain limiting ability to participate (Consult received and chart reviewed.  Per primary RN, patient's pain not well-controlled at this time.  Requests therapist hold until PM to allow additional time for pain management.  Will re-attempt in PM.)  Zania Kalisz H. Owens Shark, PT, DPT, NCS 09/04/2014, 8:56 AM [phone]

## 2014-09-04 NOTE — Clinical Social Work Note (Signed)
Clinical Social Work Assessment  Patient Details  Name: Diana Hale MRN: [id number] Date of Birth: May 11, 1931  Date of referral:  09/04/14               Reason for consult:  Facility Placement                Permission sought to share information with:  Chartered certified accountant granted to share information::  Yes, Verbal Permission Granted  Name::      [place]::   [place]   Relationship::     Contact Information:     Housing/Transportation Living arrangements for the past 2 months:  [place] of Information:  Patient Patient Interpreter Needed:  None Criminal Activity/Legal Involvement Pertinent to Current Situation/Hospitalization:  No - Comment as needed Significant Relationships:  Adult Children Lives with:  Self Do you feel safe going back to the place where you live?  Yes Need for family participation in patient care:  No (Coment)  Care giving concerns: Patient lives alone in [place].    Social Worker assessment / plan: Holiday representative (CSW) received SNF consult. PT is recommending SNF. CSW met with patient to address consult. CSW introduced self and explained role of CSW department. Patient was laying in the bed and appeared to be in pain. Patient reported that she lives in [place] alone. Patient reported that her daughter Helene Kelp [phone] is her primary support. Helene Kelp flew in from [place] last night from a church mission trip. CSW explained SNF process. Patient is agreeable to SNF search and prefers [facility]   FL2 complete and faxed out.     Employment status:  Retired Forensic scientist:  Medicare PT Recommendations:  [place] / Referral to community resources:  [place]  Patient/Family's Response to care:  Patient is agreeable to [facility] and prefers [facility]   Patient/Family's Understanding of and Emotional Response to  Diagnosis, Current Treatment, and Prognosis: Patient was pleasant throughout assessment.   Emotional Assessment Appearance:  Appears stated age Attitude/Demeanor/Rapport:    Affect (typically observed):  Quiet, Pleasant Orientation:  Oriented to Self, Oriented to Place, Oriented to  Time, Oriented to Situation Alcohol / Substance use:  Not Applicable Psych involvement (Current and /or in the community):  No (Comment)  Discharge Needs  Concerns to be addressed:  Discharge Planning Concerns Readmission within the last 30 days:  No Current discharge risk:  Lives alone Barriers to Discharge:  Continued Medical Work up   Loralyn Freshwater, LCSW 09/04/2014, 3:11 PM

## 2014-09-04 NOTE — Progress Notes (Addendum)
Clinical Education officer, museum (CSW) presented bed offers. Patient chose [facility]. Plan is for patient to D/C to [facility] on Sunday 09/06/14. Per Kim admissions coordinator at [facility] patient is going to room 214-A. RN will call report at [phone]. CSW sent D/C Summary and signed consent form to [facility] today. Patient is aware of above. CSW contacted patient's daughter Helene Kelp [phone] and made her aware of above. CSW will continue to follow and assist as needed.   Blima Rich, [place] [phone]

## 2014-09-04 NOTE — Progress Notes (Signed)
Pts. Having severe muscle spasms. Rotate valium PO 2mg  q6h prn and valium IV 2mg  IV Q4H PRN

## 2014-09-04 NOTE — Progress Notes (Addendum)
   Subjective: 1 Day Post-Op Procedure(s) (LRB): TOTAL HIP ARTHROPLASTY ANTERIOR APPROACH (Right) Patient reports pain as 10 on 0-10 scale.   Patient is having problems with pain in the right anterior thigh, requiring pain medications  No CP/SOB. No N/V We will start therapy today.    Objective: Vital signs in last 24 hours: Temp:  [97.8 F (36.6 C)-98.9 F (37.2 C)] 98.9 F (37.2 C) (07/22 0536) Pulse Rate:  [63-90] 77 (07/22 0538) Resp:  [11-27] 18 (07/22 0536) BP: (127-189)/(44-98) 143/52 mmHg (07/22 0538) SpO2:  [94 %-100 %] 95 % (07/22 0536) FiO2 (%):  [21 %] 21 % (07/21 1447) Weight:  [92.987 kg (205 lb)-100.925 kg (222 lb 8 oz)] 100.925 kg (222 lb 8 oz) (07/21 1458)  Intake/Output from previous day: 07/21 0701 - 07/22 0700 In: 2778.3 [I.V.:2628.3; IV Piggyback:150] Out: 2500 [Urine:2200; Blood:300] Intake/Output this shift:     Recent Labs  09/03/14 1528  HGB 10.6*    Recent Labs  09/03/14 1528  WBC 14.4*  RBC 3.99  HCT 33.3*  PLT 257    Recent Labs  09/03/14 1528  CREATININE 0.94   No results for input(s): LABPT, INR in the last 72 hours.  EXAM General - Patient is Alert, Appropriate and Oriented Extremity - Neurovascular intact Sensation intact distally Intact pulses distally Dorsiflexion/Plantar flexion intact Dressing - dressing C/D/I and no drainage Motor Function - intact, moving foot and toes well on exam.   Past Medical History  Diagnosis Date  . Diabetes mellitus   . Hypothyroidism   . Urinary frequency   . GERD (gastroesophageal reflux disease)   . Diverticulosis   . Arthritis   . Heart murmur   . Cancer     ovarian    Assessment/Plan:   1 Day Post-Op Procedure(s) (LRB): TOTAL HIP ARTHROPLASTY ANTERIOR APPROACH (Right) Active Problems:   Primary osteoarthritis of right hip  Estimated body mass index is 35.93 kg/(m^2) as calculated from the following:   Height as of this encounter: 5\' 6"  (1.676 m).   Weight as of this  encounter: 100.925 kg (222 lb 8 oz). Advance diet Up with therapy  Needs BM Recheck labs in the am   DVT Prophylaxis - Lovenox, Foot Pumps and TED hose Weight-Bearing as tolerated to Right leg D/C O2 and Pulse OX and try on Room Air  T. Rachelle Hora, PA-C [place] 09/04/2014, 8:04 AM

## 2014-09-04 NOTE — Clinical Social Work Placement (Signed)
   CLINICAL SOCIAL WORK PLACEMENT  NOTE  Date:  09/04/2014  Patient Details  Name: LAVENA LORETTO MRN: [id number] Date of Birth: Apr 21, 1931  Clinical Social Work is seeking post-discharge placement for this patient at the [place] level of care (*CSW will initial, date and re-position this form in  chart as items are completed):  Yes   Patient/family provided with [facility] Work Department's list of facilities offering this level of care within the geographic area requested by the patient (or if unable, by the patient's family).  Yes   Patient/family informed of their freedom to choose among providers that offer the needed level of care, that participate in Medicare, Medicaid or managed care program needed by the patient, have an available bed and are willing to accept the patient.  Yes   Patient/family informed of [facility]'s ownership interest in [facility] and [facility], as well as of the fact that they are under no obligation to receive care at these facilities.  PASRR submitted to EDS on 09/04/14     PASRR number received on 09/04/14     Existing PASRR number confirmed on       FL2 transmitted to all facilities in geographic area requested by pt/family on 09/04/14     FL2 transmitted to all facilities within larger geographic area on       Patient informed that his/her managed care company has contracts with or will negotiate with certain facilities, including the following:        Yes   Patient/family informed of bed offers received.  Patient chooses bed at  [facility] )     Physician recommends and patient chooses bed at      Patient to be transferred to   on  .  Patient to be transferred to facility by       Patient family notified on   of transfer.  Name of family member notified:        PHYSICIAN       Additional Comment:    _______________________________________________ Loralyn Freshwater,  LCSW 09/04/2014, 3:09 PM

## 2014-09-04 NOTE — Progress Notes (Signed)
Pt had hard time getting pain under control this shift. Medication administered as per patient preference. Turned and repositioned frequently as well for comfort. Will need to work with PT to build up strength.

## 2014-09-04 NOTE — Evaluation (Signed)
Physical Therapy Evaluation Patient Details Name: AMAN BATLEY MRN: [id number] DOB: [birth year] Today's Date: 09/04/2014   History of Present Illness  Pt recently had R anterior hip surgery.  Clinical Impression  Was unable to assess pt in AM secondary to pain, but stated she was improving this afternoon.  Pt was able to complete bed mobility to mod assist, she was instructed to grab railbars and pt intermittently held the PT's hand for assist with transfer. Pt was able to stand with mod assist/RW 2x for 30 sec each but was unable to move to chair secondary to pain. Pt took 3 side steps towards the chair with heavy UE compensation on the RW during R stance phase, pt couldn't make it to chair, pain limiting. Pt would benefit from skilled PT to increase R hip ROM/strength/decrease R hip pain/improve with bed mobility and sit to stand transfers.     Follow Up Recommendations SNF    Equipment Recommendations  Rolling walker with 5" wheels    Recommendations for Other Services       Precautions / Restrictions Precautions Precautions: Anterior Hip;Fall      Mobility  Bed Mobility Overal bed mobility: Needs Assistance Bed Mobility: Supine to Sit     Supine to sit: Mod assist     General bed mobility comments: Pt incructed to grab rail bars and pt intermittently held the PT's hand for assist with transfer.   Transfers Overall transfer level: Needs assistance Equipment used: Rolling walker (2 wheeled) Transfers: Sit to/from Stand Sit to Stand: Mod assist         General transfer comment: Pt was able to stand 2x for 30 sec each but was unable to move to chair secondary to pain.  Ambulation/Gait Ambulation/Gait assistance: Mod assist Ambulation Distance (Feet): 3 Feet Assistive device: Rolling walker (2 wheeled)       General Gait Details: Pt took 3 side steps towards the chair but pain limiting during R stance phase, verbal cuing to compensate with bilat UE force on RW.    Stairs            Wheelchair Mobility    Modified Rankin (Stroke Patients Only)       Balance Overall balance assessment: Needs assistance Sitting-balance support: Feet supported;Single extremity supported Sitting balance-Leahy Scale: Fair Sitting balance - Comments: Pt able to sit up with L lateral trunk lean to avoid pressure on the R hip.   Standing balance support: Bilateral upper extremity supported Standing balance-Leahy Scale: Fair Standing balance comment: Pt presents with excessive kyphosis/hip flexion and relied heavily on RW for support.                              Pertinent Vitals/Pain Pain Assessment: 0-10 Pain Score: 10-Worst pain ever (10/10 pain in incision with therex, 6/10 resting. ) Pain Location: The incision of the R hip Pain Descriptors / Indicators: Aching;Constant;Discomfort;Grimacing;Guarding;Penetrating;Sore    Home Living Family/patient expects to be discharged to:: Private residence Living Arrangements: Alone Available Help at Discharge: Friend(s);Neighbor Landscape architect) Type of Home: Mobile home Home Access: Stairs to enter Entrance Stairs-Rails: Can reach both Entrance Stairs-Number of Steps: 7 Home Layout: One level        Prior Function Level of Independence: Independent               Hand Dominance        Extremity/Trunk Assessment   Upper Extremity Assessment: Overall WFL for tasks assessed  Lower Extremity Assessment:  (Pt WFL with exception to R hip flex/knee flex secondary to 10/10 pain. )         Communication   Communication: No difficulties  Cognition Arousal/Alertness: Awake/alert Behavior During Therapy: WFL for tasks assessed/performed Overall Cognitive Status: Within Functional Limits for tasks assessed                      General Comments      Exercises Other Exercises Other Exercises: Supine bilat ankle pumps/quad sets/AA heel slides/abduction, 1 x 10.  Attempted SLR but was pain limiting. (25min)      Assessment/Plan    PT Assessment Patient needs continued PT services  PT Diagnosis Difficulty walking;Abnormality of gait;Generalized weakness;Acute pain   PT Problem List Decreased strength;Decreased range of motion;Decreased activity tolerance;Decreased balance;Decreased mobility;Decreased safety awareness;Decreased knowledge of use of DME;Pain  PT Treatment Interventions DME instruction;Gait training;Stair training;Functional mobility training;Therapeutic activities;Therapeutic exercise;Patient/family education;Balance training   PT Goals (Current goals can be found in the Care Plan section) Acute Rehab PT Goals Patient Stated Goal: leave the hospital PT Goal Formulation: With patient Time For Goal Achievement: 09/18/14 Potential to Achieve Goals: Good    Frequency BID   Barriers to discharge Inaccessible home environment Seven steps to enter home.    Co-evaluation               End of Session Equipment Utilized During Treatment: Gait belt Activity Tolerance: Patient limited by pain Patient left: in bed;with call bell/phone within reach;with bed alarm set;with family/visitor present Nurse Communication: Mobility status;Patient requests pain meds         Time: 7048-8891 PT Time Calculation (min) (ACUTE ONLY): 37 min   Charges:         PT G Codes:        Bernestine Amass, SPT 09/04/2014 3:18 PM

## 2014-09-05 LAB — GLUCOSE, CAPILLARY
GLUCOSE-CAPILLARY: 173 mg/dL — AB (ref 65–99)
GLUCOSE-CAPILLARY: 160 mg/dL — AB (ref 65–99)
Glucose-Capillary: 126 mg/dL — ABNORMAL HIGH (ref 65–99)
Glucose-Capillary: 142 mg/dL — ABNORMAL HIGH (ref 65–99)

## 2014-09-05 LAB — CBC
HEMATOCRIT: 31.3 % — AB (ref 35.0–47.0)
Hemoglobin: 10.1 g/dL — ABNORMAL LOW (ref 12.0–16.0)
MCH: 27 pg (ref 26.0–34.0)
MCHC: 32.3 g/dL (ref 32.0–36.0)
MCV: 83.7 fL (ref 80.0–100.0)
PLATELETS: 215 10*3/uL (ref 150–440)
RBC: 3.74 MIL/uL — ABNORMAL LOW (ref 3.80–5.20)
RDW: 14.2 % (ref 11.5–14.5)
WBC: 10.6 10*3/uL (ref 3.6–11.0)

## 2014-09-05 MED ORDER — LACTULOSE 10 GM/15ML PO SOLN
10.0000 g | Freq: Two times a day (BID) | ORAL | Status: DC | PRN
  Administered 2014-09-06: 10 g via ORAL
  Filled 2014-09-05: qty 30

## 2014-09-05 MED ORDER — TRAMADOL HCL 50 MG PO TABS
50.0000 mg | ORAL_TABLET | ORAL | Status: DC | PRN
  Administered 2014-09-06: 50 mg via ORAL
  Filled 2014-09-05: qty 1

## 2014-09-05 NOTE — Progress Notes (Signed)
Physical Therapy Treatment Patient Details Name: Diana Hale MRN: [id number] DOB: 08/12/1931 Today's Date: 01-Oct-2014    History of Present Illness This patient is an 81 year old female who came to [facility] for a R THR (anterior approach).    PT Comments    Pt was seen this AM for gait and there ex after having new hip surgery on 7/21, doing well and motivated to get to SNF tomorrow.    Follow Up Recommendations  SNF     Equipment Recommendations  Rolling walker with 5" wheels    Recommendations for Other Services       Precautions / Restrictions Precautions Precautions: Anterior Hip;Fall Restrictions Weight Bearing Restrictions: Yes Other Position/Activity Restrictions: WBAT    Mobility  Bed Mobility               General bed mobility comments: up when PT entered  Transfers Overall transfer level: Needs assistance Equipment used: Rolling walker (2 wheeled)   Sit to Stand: Min assist Stand pivot transfers: Min guard       General transfer comment: Needs some extra help but very able to assist to power up with hands on chair  Ambulation/Gait     Assistive device: Rolling walker (2 wheeled)   Gait velocity: reduced   General Gait Details: walking on RW with more comfort, did get light headed but upon sitting noted sat 97%   Stairs            Wheelchair Mobility    Modified Rankin (Stroke Patients Only)       Balance     Sitting balance-Leahy Scale: Good     Standing balance support: Bilateral upper extremity supported Standing balance-Leahy Scale: Fair Standing balance comment: fair- dynamic balance                    Cognition Arousal/Alertness: Awake/alert Behavior During Therapy: WFL for tasks assessed/performed Overall Cognitive Status: Within Functional Limits for tasks assessed                      Exercises      General Comments        Pertinent Vitals/Pain Faces Pain Scale: Hurts little more Pain  Location: incision Pain Descriptors / Indicators: Operative site guarding Pain Intervention(s): Limited activity within patient's tolerance;Premedicated before session    Home Living                      Prior Function            PT Goals (current goals can now be found in the care plan section) Acute Rehab PT Goals Patient Stated Goal: Less pain    Frequency  BID    PT Plan Current plan remains appropriate    Co-evaluation             End of Session Equipment Utilized During Treatment: Gait belt Activity Tolerance: Patient limited by pain;Other (comment) (lightheadedness) Patient left: in chair;with call bell/phone within reach;with chair alarm set;Other (comment) (added compression to legs to elevate BP)     Time: 0347-4259 PT Time Calculation (min) (ACUTE ONLY): 19 min  Charges:  $Gait Training: 8-22 mins $Therapeutic Exercise: 8-22 mins                    G Codes:      Ramond Dial 01-Oct-2014, 1:23 PM   Mee Hives, PT MS Acute Rehab Dept. Number: [facility] [phone]  and [place] [phone]

## 2014-09-05 NOTE — Progress Notes (Signed)
Patient cooperative with care throughout shift.  Complains of pain that is improving with PRN pain medication and scheduled MS Contin.  During PT this morning shift patient complained of feeling light headed, paused during PT and lightheadedness improved.  Second PT session much improved.  Patient plans to go to [facility] tomorrow.  Needs to have positive bowel movement.

## 2014-09-05 NOTE — Progress Notes (Signed)
   Subjective: 2 Days Post-Op Procedure(s) (LRB): TOTAL HIP ARTHROPLASTY ANTERIOR APPROACH (Right) Patient reports pain as 6 on 0-10 scale.  Yesterday's pain was either 8 or she was asleep. Pt states she is doing much better now. Sleep great. Patient is well, and has had no acute complaints or problems Continue with physical  therapy today.  Plan is to go Rehab after hospital stay. no nausea and no vomiting Patient denies any chest pains or shortness of breath. Objective: Vital signs in last 24 hours: Temp:  [98.9 F (37.2 C)-100.6 F (38.1 C)] 98.9 F (37.2 C) (07/23 0505) Pulse Rate:  [79-91] 80 (07/23 0505) Resp:  [18] 18 (07/23 0505) BP: (116-148)/(39-50) 135/39 mmHg (07/23 0505) SpO2:  [89 %-97 %] 97 % (07/23 0600) well approximated incision Heels are non tender and elevated off the bed using rolled towels Intake/Output from previous day: 07/22 0701 - 07/23 0700 In: 120 [P.O.:120] Out: 1650 [Urine:1650] Intake/Output this shift:     Recent Labs  09/03/14 1528 09/05/14 0420  HGB 10.6* 10.1*    Recent Labs  09/03/14 1528 09/05/14 0420  WBC 14.4* 10.6  RBC 3.99 3.74*  HCT 33.3* 31.3*  PLT 257 215    Recent Labs  09/03/14 1528 09/04/14 0808  NA  --  137  K  --  4.4  CL  --  103  CO2  --  26  BUN  --  9  CREATININE 0.94 0.91  GLUCOSE  --  144*  CALCIUM  --  8.7*   No results for input(s): LABPT, INR in the last 72 hours.  EXAM General - Patient is Alert, Appropriate and Oriented Extremity - Neurologically intact Neurovascular intact Sensation intact distally Intact pulses distally Dorsiflexion/Plantar flexion intact Dressing - dressing C/D/I Motor Function - intact, moving foot and toes well on exam.   Past Medical History  Diagnosis Date  . Diabetes mellitus   . Hypothyroidism   . Urinary frequency   . GERD (gastroesophageal reflux disease)   . Diverticulosis   . Arthritis   . Heart murmur   . Cancer     ovarian     Assessment/Plan: 2 Days Post-Op Procedure(s) (LRB): TOTAL HIP ARTHROPLASTY ANTERIOR APPROACH (Right) Active Problems:   Primary osteoarthritis of right hip  Estimated body mass index is 35.93 kg/(m^2) as calculated from the following:   Height as of this encounter: 5\' 6"  (1.676 m).   Weight as of this encounter: 100.925 kg (222 lb 8 oz). Up with therapy Plan for discharge tomorrow Discharge to SNF  Labs: were reviewed DVT Prophylaxis - Lovenox, Foot Pumps and TED hose Weight-Bearing as tolerated to right leg Needs to have a bowel movement today Needs incentive spirometer. O2 dropped to 89 yesterdsy Dressing changed today  Jon R. Roann [place] 09/05/2014, 7:17 AM

## 2014-09-05 NOTE — Progress Notes (Signed)
Physical Therapy Treatment Patient Details Name: Diana Hale MRN: [id number] DOB: 1931-04-29 Today's Date: 09/05/2014    History of Present Illness This patient is an 81 year old female who came to [facility] for a R THR (anterior approach).    PT Comments    Pt was able to be seen for final visit today and walked much better with encouragement of her daughter.  Pt is motivated and looking forward to her dc tomorrow to next venue.  Pain is less with gait and mainly fatigued with effort but sat up bedside for lunch today.  Follow Up Recommendations  SNF     Equipment Recommendations  Rolling walker with 5" wheels    Recommendations for Other Services       Precautions / Restrictions Precautions Precautions: Anterior Hip;Fall Restrictions Weight Bearing Restrictions: Yes Other Position/Activity Restrictions: WBAT    Mobility  Bed Mobility Overal bed mobility: Needs Assistance Bed Mobility: Supine to Sit;Sit to Supine     Supine to sit: Min assist Sit to supine: Min assist      Transfers Overall transfer level: Needs assistance Equipment used: Rolling walker (2 wheeled)   Sit to Stand: Min assist Stand pivot transfers: Min guard       General transfer comment: Needs some extra help but very able to assist to power up with hands on chair  Ambulation/Gait   Ambulation Distance (Feet): 150 Feet Assistive device: Rolling walker (2 wheeled)   Gait velocity: reduced   General Gait Details: walking with reciprocal pattern and noted her gait more controlled on RLE, needed no help to adjust to sit back down   Stairs            Wheelchair Mobility    Modified Rankin (Stroke Patients Only)       Balance     Sitting balance-Leahy Scale: Good       Standing balance-Leahy Scale: Fair                      Cognition Arousal/Alertness: Awake/alert Behavior During Therapy: WFL for tasks assessed/performed Overall Cognitive Status: Within  Functional Limits for tasks assessed                      Exercises      General Comments        Pertinent Vitals/Pain Faces Pain Scale: Hurts little more Pain Location: hip Pain Intervention(s): Limited activity within patient's tolerance;Premedicated before session;Repositioned    Home Living                      Prior Function            PT Goals (current goals can now be found in the care plan section) Acute Rehab PT Goals Patient Stated Goal: Less pain    Frequency  BID    PT Plan Current plan remains appropriate    Co-evaluation             End of Session Equipment Utilized During Treatment: Gait belt Activity Tolerance: Patient limited by pain;Other (comment) (lightheadedness) Patient left: with call bell/phone within reach;in bed;with bed alarm set;with family/visitor present     Time: 7408-1448 PT Time Calculation (min) (ACUTE ONLY): 30 min  Charges:  $Gait Training: 8-22 mins $Therapeutic Exercise: 8-22 mins                    G Codes:      Diana Hale,  Diana Hale 09/05/2014, 5:42 PM   Diana Hale, PT MS Acute Rehab Dept. Number: [facility] [id number] and [place] [phone]

## 2014-09-06 LAB — GLUCOSE, CAPILLARY
Glucose-Capillary: 183 mg/dL — ABNORMAL HIGH (ref 65–99)
Glucose-Capillary: 104 mg/dL — ABNORMAL HIGH (ref 65–99)

## 2014-09-06 MED ORDER — TRAMADOL HCL 50 MG PO TABS: 50.0000 mg | ORAL_TABLET | ORAL | Status: DC | PRN

## 2014-09-06 MED ORDER — ENOXAPARIN SODIUM 40 MG/0.4ML ~~LOC~~ SOLN: 40.0000 mg | SUBCUTANEOUS | Status: DC

## 2014-09-06 MED ORDER — HYDROCODONE-ACETAMINOPHEN 10-325 MG PO TABS: 1.0000 | ORAL_TABLET | ORAL | Status: DC | PRN

## 2014-09-06 MED ORDER — MORPHINE SULFATE ER 30 MG PO TBCR: 30.0000 mg | EXTENDED_RELEASE_TABLET | Freq: Two times a day (BID) | ORAL | Status: DC

## 2014-09-06 NOTE — Progress Notes (Signed)
Called report to Aldona Bar, RN.  Called transport.

## 2014-09-06 NOTE — Discharge Instructions (Signed)
Patient Name Sex DOB SSN  Diana Hale  @GENDER @ 1931-12-27 [id number]    Discharge Planning by Vance Peper R.   Author: Watt Climes Service: Orthopedics Author Type: Physician Assistant    Filed: @T @ Note Time: 6:58 AM Status: Signed   Editor: Watt Climes., PA     Expand All Collapse All   ANTERIOR APPROACH TOTAL HIP REPLACEMENT POSTOPERATIVE DIRECTIONS   Hip Rehabilitation, Guidelines Following Surgery  The results of a hip operation are greatly improved after range of motion and muscle strengthening exercises. Follow all safety measures which are given to protect your hip. If any of these exercises cause increased pain or swelling in your joint, decrease the amount until you are comfortable again. Then slowly increase the exercises. Call your caregiver if you have problems or questions.   HOME CARE INSTRUCTIONS   Remove items at home which could result in a fall. This includes throw rugs or furniture in walking pathways.   ICE to the affected hip every three hours for 30 minutes at a time and then as needed for pain and swelling. Continue to use ice on the hip for pain and swelling from surgery. You may notice swelling that will progress down to the foot and ankle. This is normal after surgery. Elevate the leg when you are not up walking on it.   Continue to use the breathing machine which will help keep your temperature down. It is common for your temperature to cycle up and down following surgery, especially at night when you are not up moving around and exerting yourself. The breathing machine keeps your lungs expanded and your temperature down.  Do not place pillow under knee, focus on keeping the knee straight while resting  DIET You may resume your previous home diet once your are discharged from the hospital.  DRESSING / WOUND CARE / SHOWERING Change the surgical dressing as needed If the wound gets wet inside, change the dressing with sterile gauze. Please  use good hand washing techniques before changing the dressing. Do not use any lotions or creams on the incision until instructed by your surgeon.Keep your dressing dry with showering.  Keep your dressing dry with showering. You can keep it covered and pat dry.  STAPLE REMOVAL: Please remove staples two weeks post op and apply benzoin and 1/2 inch steri strips  ACTIVITY Walk with your walker as instructed. Use walker as long as suggested by your caregivers. Avoid periods of inactivity such as sitting longer than an hour when not asleep. This helps prevent blood clots.  You may resume a sexual relationship in one month or when given the OK by your doctor.  You may return to work once you are cleared by your doctor.  Do not drive a car for 6 weeks or until released by you surgeon.  Do not drive while taking narcotics.  WEIGHT BEARING Weight bearing as tolerated with assist device (walker, cane, etc) as directed, use it as long as suggested by your surgeon or therapist, typically at least 4-6 weeks.  POSTOPERATIVE CONSTIPATION PROTOCOL Constipation - defined medically as fewer than three stools per week and severe constipation as less than one stool per week.  One of the most common issues patients have following surgery is constipation. Even if you have a regular bowel pattern at home, your normal regimen is likely to be disrupted due to multiple reasons following surgery. Combination of anesthesia, postoperative narcotics, change in appetite and fluid intake all can affect your bowels.  In order to avoid complications following surgery, here are some recommendations in order to help you during your recovery period.  Colace (docusate) - Pick up an over-the-counter form of Colace or another stool softener and take twice a day as long as you are requiring postoperative pain medications. Take with a full glass of water daily. If you experience loose stools or diarrhea, hold the colace  until you stool forms back up. If your symptoms do not get better within 1 week or if they get worse, check with your doctor.  Dulcolax (bisacodyl) - Pick up over-the-counter and take as directed by the product packaging as needed to assist with the movement of your bowels. Take with a full glass of water. Use this product as needed if not relieved by Colace only.   MiraLax (polyethylene glycol) - Pick up over-the-counter to have on hand. MiraLax is a solution that will increase the amount of water in your bowels to assist with bowel movements. Take as directed and can mix with a glass of water, juice, soda, coffee, or tea. Take if you go more than two days without a movement. Do not use MiraLax more than once per day. Call your doctor if you are still constipated or irregular after using this medication for 7 days in a row.  If you continue to have problems with postoperative constipation, please contact the office for further assistance and recommendations. If you experience "the worst abdominal pain ever" or develop nausea or vomiting, please contact the office immediatly for further recommendations for treatment.  ITCHING If you experience itching with your medications, try taking only a single pain pill, or even half a pain pill at a time. You can also use Benadryl over the counter for itching or also to help with sleep.   TED HOSE STOCKINGS Wear the elastic stockings on both legs for 6 weeks following surgery during the day but you may remove then at night for sleeping.  MEDICATIONS See your medication summary on the After Visit Summary that the nursing staff will review with you prior to discharge. You may have some home medications which will be placed on hold until you complete the course of blood thinner medication. It is important for you to complete the blood thinner medication as prescribed by your surgeon. Continue your approved medications as instructed at time of  discharge.  PRECAUTIONS If you experience chest pain or shortness of breath - call 911 immediately for transfer to the hospital emergency department.  If you develop a fever greater that 101 F, purulent drainage from wound, increased redness or drainage from wound, foul odor from the wound/dressing, or calf pain - CONTACT YOUR SURGEON.    FOLLOW-UP APPOINTMENTS Make sure you keep all of your appointments after your operation with your surgeon and caregivers. You should call the office at the above phone number and make an appointment for approximately two weeks after the date of your surgery or on the date instructed by your surgeon outlined in the "After Visit Summary".  RANGE OF MOTION AND STRENGTHENING EXERCISES  These exercises are designed to help you keep full movement of your hip joint. Follow your caregiver's or physical therapist's instructions. Perform all exercises about fifteen times, three times per day or as directed. Exercise both hips, even if you have had only one joint replacement. These exercises can be done on a training (exercise) mat, on the floor, on a table or on a bed. Use whatever works the best and is  most comfortable for you. Use music or television while you are exercising so that the exercises are a pleasant break in your day. This will make your life better with the exercises acting as a break in routine you can look forward to.   Lying on your back, slowly slide your foot toward your buttocks, raising your knee up off the floor. Then slowly slide your foot back down until your leg is straight again.   Lying on your back spread your legs as far apart as you can without causing discomfort.   Lying on your side, raise your upper leg and foot straight up from the floor as far as is comfortable. Slowly lower the leg and repeat.   Lying on your back, tighten up the muscle in the front of your thigh (quadriceps muscles).  You can do this by keeping your leg straight and trying to raise your heel off the floor. This helps strengthen the largest muscle supporting your knee.   Lying on your back, tighten up the muscles of your buttocks both with the legs straight and with the knee bent at a comfortable angle while keeping your heel on the floor.  IF YOU ARE TRANSFERRED TO A SKILLED REHAB FACILITY If the patient is transferred to a skilled rehab facility following release from the hospital, a list of the current medications will be sent to the facility for the patient to continue. When discharged from the skilled rehab facility, please have the facility set up the patient's [place] prior to being released. Also, the skilled facility will be responsible for providing the patient with their medications at time of release from the facility to include their pain medication, the muscle relaxants, and their blood thinner medication. If the patient is still at the rehab facility at time of the two week follow up appointment, the skilled rehab facility will also need to assist the patient in arranging follow up appointment in our office and any transportation needs.  MAKE SURE YOU:   Understand these instructions.   Get help right away if you are not doing well or get worse.   Pick up stool softner and laxative for home use following surgery while on pain medications. Do not submerge incision under water. Please use good hand washing techniques while changing dressing each day. May shower starting three days after surgery. Please use a clean towel to pat the incision dry following showers. Continue to use ice for pain and swelling after surgery. Do not use any lotions or creams on the incision until instructed by your surgeon.

## 2014-09-06 NOTE — Clinical Social Work Placement (Signed)
   CLINICAL SOCIAL WORK PLACEMENT  NOTE  Date:  09/06/2014  Patient Details  Name: Diana Hale MRN: [id number] Date of Birth: January 11, 1932  Clinical Social Work is seeking post-discharge placement for this patient at the [place] level of care (*CSW will initial, date and re-position this form in  chart as items are completed):  Yes   Patient/family provided with [place] Work Department's list of facilities offering this level of care within the geographic area requested by the patient (or if unable, by the patient's family).  Yes   Patient/family informed of their freedom to choose among providers that offer the needed level of care, that participate in Medicare, Medicaid or managed care program needed by the patient, have an available bed and are willing to accept the patient.  Yes   Patient/family informed of [facility]'s ownership interest in [facility] and [facility], as well as of the fact that they are under no obligation to receive care at these facilities.  PASRR submitted to EDS on 09/04/14     PASRR number received on 09/04/14     Existing PASRR number confirmed on       FL2 transmitted to all facilities in geographic area requested by pt/family on 09/04/14     FL2 transmitted to all facilities within larger geographic area on       Patient informed that his/her managed care company has contracts with or will negotiate with certain facilities, including the following:        Yes   Patient/family informed of bed offers received.  Patient chooses bed at  [facility] )     Physician recommends and patient chooses bed at      Patient to be transferred to  [facility]) on 09/06/14.  Patient to be transferred to facility by  [facility])     Patient family notified on 09/06/14 of transfer.  Name of family member notified:   Helene Kelp, daughter)     PHYSICIAN       Additional Comment:     _______________________________________________ Darden Dates, LCSW 09/06/2014, 11:04 AM

## 2014-09-06 NOTE — Progress Notes (Signed)
PT Cancellation Note  Patient Details Name: Diana Hale MRN: [id number] DOB: 10/06/1931   Cancelled Treatment:    Reason Eval/Treat Not Completed: Fatigue/lethargy limiting ability to participate;Other (comment) (Pt reports her daughter told her she overdid therapy).  Reminded pt she walked a reasonable distance and that she should use ice for pain management.  Brought her an ice pack and will ck later if time allows.  Per nursing she is leaving for SNF soon.   Ramond Dial 09/06/2014, 9:27 AM   Mee Hives, PT MS Acute Rehab Dept. Number: [facility] [id number] and [facility] [phone]

## 2014-09-06 NOTE — Clinical Social Work Note (Signed)
Pt is ready for discharge today to [facility]. Pt and daughter are agreeable to discharge plan. RN to call report and EMS will provide transportation. CSW is signing off as no further needs identified.   Darden Dates, MSW, LCSW Clinical Social Worker  [phone]

## 2014-09-07 NOTE — Discharge Summary (Signed)
Physician Discharge Summary  Patient ID: Diana Hale MRN: [id number] DOB/AGE: Dec 01, 1931 81 y.o.  Admit date: 09/04/2014 Discharge date: 09/07/2014  Admission Diagnoses:  THR   Discharge Diagnoses: Patient Active Problem List   Diagnosis Date Noted  . Primary osteoarthritis of right hip 09/03/2014    Past Medical History  Diagnosis Date  . Diabetes mellitus   . Hypothyroidism   . Urinary frequency   . GERD (gastroesophageal reflux disease)   . Diverticulosis   . Arthritis   . Heart murmur   . Cancer     ovarian    Discharged Condition: Improved  Hospital Course: Diana Hale is an 81 y.o. female who was admitted 09/04/2014 with a diagnosis of <principal problem not specified> and went to the operating room on Right total hip arthroplasty and underwent the above named procedures.    Surgeries:  on  Right total hip arthroplasty Patient tolerated the surgery well. Taken to PACU where she was stabilized and then transferred to the orthopedic floor.  Started on Lovenox 40 q 12 hrs. Foot pumps applied bilaterally at 80 mm. Heels elevated on bed with rolled towels. No evidence of DVT. Negative Homan. Physical therapy started on day #1 for gait training and transfer. OT started day #1 for ADL and assisted devices.  Patient's foley was d/c on day #1. Patient's IV and hemovac was d/c on day #2.  On post op day #3 patient was stable and ready for discharge to  Rehab facility on 09/06/2014.  Implants: Medacta AMIS 3 standard stem, 54 mm Mpact DM cup, liner and S 28 mm head  She was given perioperative antibiotics:  Anti-infectives    None    .  She was given sequential compression devices, early ambulation, and  Lovenox for DVT prophylaxis.  She benefited maximally from the hospital stay and there were no complications.    Recent vital signs: There were no vitals filed for this visit.  Recent laboratory studies:  Lab Results  Component Value Date   HGB 10.1*  09/05/2014   HGB 10.6* 09/03/2014   HGB 10.8* 08/26/2014   Lab Results  Component Value Date   WBC 10.6 09/05/2014   PLT 215 09/05/2014   Lab Results  Component Value Date   INR 0.94 08/26/2014   Lab Results  Component Value Date   NA 137 09/04/2014   K 4.4 09/04/2014   CL 103 09/04/2014   CO2 26 09/04/2014   BUN 9 09/04/2014   CREATININE 0.91 09/04/2014   GLUCOSE 144* 09/04/2014    Discharge Medications:     Medication List    ASK your doctor about these medications        ASTEPRO 0.1 % nasal spray  Generic drug:  azelastine  Place 1 spray into the nose 2 (two) times daily. Use in each nostril as directed     Cholecalciferol 2000 UNITS Caps  Take 1 capsule by mouth daily.     enoxaparin 40 MG/0.4ML injection  Commonly known as:  LOVENOX  Inject 0.4 mLs (40 mg total) into the skin daily.     HYDROcodone-acetaminophen 10-325 MG per tablet  Commonly known as:  NORCO  Take 1 tablet by mouth 3 (three) times daily as needed.     HYDROcodone-acetaminophen 10-325 MG per tablet  Commonly known as:  NORCO  Take 1-2 tablets by mouth every 4 (four) hours as needed (breakthrough pain).     insulin aspart protamine- aspart (70-30) 100 UNIT/ML injection  Commonly  known as:  NOVOLOG MIX 70/30  Inject 35-55 Units into the skin 2 (two) times daily with a meal. Sliding scale     levothyroxine 100 MCG tablet  Commonly known as:  SYNTHROID, LEVOTHROID  Take 100 mcg by mouth daily before breakfast.     morphine 30 MG 12 hr tablet  Commonly known as:  MS CONTIN  Take 1 tablet (30 mg total) by mouth every 12 (twelve) hours.     Oxycodone HCl 10 MG Tabs  Take 10-20 mg by mouth at bedtime as needed. 1-2 tablets by mouth at bedtime as needed for pain     pantoprazole 40 MG tablet  Commonly known as:  PROTONIX  Take 40 mg by mouth 2 (two) times daily.     polyethylene glycol packet  Commonly known as:  MIRALAX / GLYCOLAX  Take 17 g by mouth 2 (two) times daily.      traMADol 50 MG tablet  Commonly known as:  ULTRAM  Take 1-2 tablets (50-100 mg total) by mouth every 4 (four) hours as needed for moderate pain.     Vitamin B-12 2500 MCG Subl  Place 1 tablet under the tongue daily.        Diagnostic Studies: Dg C-arm 61-120 Min  09/03/2014   CLINICAL DATA:  Right hip replacement.  EXAM: DG C-ARM 61-120 MIN; OPERATIVE RIGHT HIP WITH PELVIS  COMPARISON:  None.  FINDINGS: Four C arm views of the right hip demonstrate progressive placement of a right hip prosthesis. The visualized portions are in satisfactory position and alignment. No associated fracture visualized.  FLUOROSCOPY TIME:  0 minutes 23 seconds.  IMPRESSION: Right hip replacement.   Electronically Signed   By: Claudie Revering M.D.   On: 09/03/2014 13:32   Dg Hip Operative Unilat With Pelvis Right  09/03/2014   CLINICAL DATA:  Right hip replacement.  EXAM: DG C-ARM 61-120 MIN; OPERATIVE RIGHT HIP WITH PELVIS  COMPARISON:  None.  FINDINGS: Four C arm views of the right hip demonstrate progressive placement of a right hip prosthesis. The visualized portions are in satisfactory position and alignment. No associated fracture visualized.  FLUOROSCOPY TIME:  0 minutes 23 seconds.  IMPRESSION: Right hip replacement.   Electronically Signed   By: Claudie Revering M.D.   On: 09/03/2014 13:32   Dg Hip Unilat W Or W/o Pelvis 2-3 Views Right  09/03/2014   CLINICAL DATA:  Post right total hip replacement  EXAM: DG HIP (WITH OR WITHOUT PELVIS) 2-3V RIGHT  COMPARISON:  None.  FINDINGS: Post right total hip replacement. No evidence of hardware failure or loosening. Alignment appears anatomic. S  Kin staples overlie the superior lateral aspect of the operative site. Post paraspinal fusion involving the lower lumbar spine, incompletely evaluated. No radiopaque foreign body.  IMPRESSION: Post right total hip replacement without evidence of complication.   Electronically Signed   By: Sandi Mariscal M.D.   On: 09/03/2014 14:13     Disposition: 03-Skilled Nursing Facility 09/06/2014.  Stable condition.       Signed: Dorise Hiss CHRISTOPHER 09/07/2014, 12:30 PM

## 2014-09-07 NOTE — Progress Notes (Signed)
Kim admissions coordinator at [facility] requested D/C Summary. PA updated D/C Summary today. Clinical Education officer, museum (CSW) sent D/C Summary to [employer] via [facility].   Blima Rich, [place] [phone]

## 2014-09-08 LAB — SURGICAL PATHOLOGY

## 2014-09-14 ENCOUNTER — Encounter: Admission: RE | Admit: 2014-09-14 | Payer: Medicare Other | Source: Ambulatory Visit | Admitting: Internal Medicine

## 2014-10-27 ENCOUNTER — Ambulatory Visit: Payer: Medicare Other | Attending: Pain Medicine | Admitting: Pain Medicine

## 2014-10-27 ENCOUNTER — Encounter: Payer: Self-pay | Admitting: Pain Medicine

## 2014-10-27 VITALS — BP 111/54 | HR 108 | Temp 98.1°F | Resp 18 | Ht 66.0 in | Wt 195.0 lb

## 2014-10-27 DIAGNOSIS — Z981 Arthrodesis status: Secondary | ICD-10-CM | POA: Insufficient documentation

## 2014-10-27 DIAGNOSIS — M5136 Other intervertebral disc degeneration, lumbar region: Secondary | ICD-10-CM | POA: Diagnosis not present

## 2014-10-27 DIAGNOSIS — M47816 Spondylosis without myelopathy or radiculopathy, lumbar region: Secondary | ICD-10-CM

## 2014-10-27 DIAGNOSIS — Z9889 Other specified postprocedural states: Secondary | ICD-10-CM | POA: Diagnosis not present

## 2014-10-27 DIAGNOSIS — M961 Postlaminectomy syndrome, not elsewhere classified: Secondary | ICD-10-CM

## 2014-10-27 DIAGNOSIS — M533 Sacrococcygeal disorders, not elsewhere classified: Secondary | ICD-10-CM | POA: Diagnosis not present

## 2014-10-27 DIAGNOSIS — Z8543 Personal history of malignant neoplasm of ovary: Secondary | ICD-10-CM | POA: Insufficient documentation

## 2014-10-27 DIAGNOSIS — E119 Type 2 diabetes mellitus without complications: Secondary | ICD-10-CM | POA: Insufficient documentation

## 2014-10-27 DIAGNOSIS — M545 Low back pain: Secondary | ICD-10-CM | POA: Diagnosis present

## 2014-10-27 DIAGNOSIS — E039 Hypothyroidism, unspecified: Secondary | ICD-10-CM | POA: Insufficient documentation

## 2014-10-27 DIAGNOSIS — M79604 Pain in right leg: Secondary | ICD-10-CM | POA: Diagnosis present

## 2014-10-27 DIAGNOSIS — M51369 Other intervertebral disc degeneration, lumbar region without mention of lumbar back pain or lower extremity pain: Secondary | ICD-10-CM

## 2014-10-27 DIAGNOSIS — M79605 Pain in left leg: Secondary | ICD-10-CM | POA: Diagnosis present

## 2014-10-27 DIAGNOSIS — Z96641 Presence of right artificial hip joint: Secondary | ICD-10-CM

## 2014-10-27 NOTE — Patient Instructions (Addendum)
PLAN   Continue present medication   Lumbar facet, medial branch nerve, blocks to be performed at time return appointment  F/U PCP  Dr. Ramonita Lab for evaliation of  BP and general medical  condition  F/U surgical evaluation. May consider pending follow-up evaluations  F/U neurological evaluation. May consider pending follow-up evaluations  May consider radiofrequency rhizolysis or intraspinal procedures pending response to present treatment and F/U evaluation   Patient to call Pain Management Center should patient have concerns prior to scheduled return appointment. Facet Blocks Patient Information  Description: The facets are joints in the spine between the vertebrae.  Like any joints in the body, facets can become irritated and painful.  Arthritis can also effect the facets.  By injecting steroids and local anesthetic in and around these joints, we can temporarily block the nerve supply to them.  Steroids act directly on irritated nerves and tissues to reduce selling and inflammation which often leads to decreased pain.  Facet blocks may be done anywhere along the spine from the neck to the low back depending upon the location of your pain.   After numbing the skin with local anesthetic (like Novocaine), a small needle is passed onto the facet joints under x-ray guidance.  You may experience a sensation of pressure while this is being done.  The entire block usually lasts about 15-25 minutes.   Conditions which may be treated by facet blocks:   Low back/buttock pain  Neck/shoulder pain  Certain types of headaches  Preparation for the injection:  1. Do not eat any solid food or dairy products within 6 hours of your appointment. 2. You may drink clear liquid up to 2 hours before appointment.  Clear liquids include water, black coffee, juice or soda.  No milk or cream please. 3. You may take your regular medication, including pain medications, with a sip of water before your  appointment.  Diabetics should hold regular insulin (if taken separately) and take 1/2 normal NPH dose the morning of the procedure.  Carry some sugar containing items with you to your appointment. 4. A driver must accompany you and be prepared to drive you home after your procedure. 5. Bring all your current medications with you. 6. An IV may be inserted and sedation may be given at the discretion of the physician. 7. A blood pressure cuff, EKG and other monitors will often be applied during the procedure.  Some patients may need to have extra oxygen administered for a short period. 8. You will be asked to provide medical information, including your allergies and medications, prior to the procedure.  We must know immediately if you are taking blood thinners (like Coumadin/Warfarin) or if you are allergic to IV iodine contrast (dye).  We must know if you could possible be pregnant.  Possible side-effects:   Bleeding from needle site  Infection (rare, may require surgery)  Nerve injury (rare)  Numbness & tingling (temporary)  Difficulty urinating (rare, temporary)  Spinal headache (a headache worse with upright posture)  Light-headedness (temporary)  Pain at injection site (serveral days)  Decreased blood pressure (rare, temporary)  Weakness in arm/leg (temporary)  Pressure sensation in back/neck (temporary)   Call if you experience:   Fever/chills associated with headache or increased back/neck pain  Headache worsened by an upright position  New onset, weakness or numbness of an extremity below the injection site  Hives or difficulty breathing (go to the emergency room)  Inflammation or drainage at the injection site(s)  Severe back/neck pain greater than usual  New symptoms which are concerning to you  Please note:  Although the local anesthetic injected can often make your back or neck feel good for several hours after the injection, the pain will likely return.  It takes 3-7 days for steroids to work.  You may not notice any pain relief for at least one week.  If effective, we will often do a series of 2-3 injections spaced 3-6 weeks apart to maximally decrease your pain.  After the initial series, you may be a candidate for a more permanent nerve block of the facets.  If you have any questions, please call #[phone]) [facility]

## 2014-10-27 NOTE — Progress Notes (Signed)
Subjective:    Patient ID: Diana Hale, female    DOB: [birth year], 81 y.o.   MRN: [id number]  HPI  The patient is a 81 year old female who comes to pain management Center at the request of Dr. Ramonita Lab for further evaluation and treatment of pain involving the lower back and lower extremity regions predominantly the patient is with prior surgical intervention of the lumbar region. Patient states that she has significant pain involving the lower back which is aggravated by standing and walking. Patient is without plans for additional surgical intervention of the lumbar region. The patient described her pain as heavy stabbing throbbing pressure-like sensation of the lower back patient stated the pain increases with walking bending kneeling and the pain decreased with resting Colpacs and lying down. Patient stated that she is presently taking medications for treatment of her pain and that she would like to be able to reduce the amount of medication she is taking. We discussed patient's condition and informed patient that we are unable to assure her that she will be able to reduce her medication requirement. We informed patient that we'll consider modifications of treatment regimen and proceed with interventional treatment in attempt to decrease the severity of patient's pain and hopefully decrease the need for patient's medications condition. We again informed patient that we would be unable to assure the patient would be able to decrease her medications. We told patient that patient may need an increase of medications as time progressed we informed patient that we would make all attempts to meet her needs of decreasing the medications and will consider additional modifications of treatment as well as additional referrals as well the patient was in agreement with suggested treatment plan we will proceed with lumbar facet, medial branch nerve blocks at time return appointment in attempt to decrease  severity of patient's symptoms, minimize progression of symptoms, and avoid the need for more involved treatment the patient was in agreement with suggested treatment plan      Review of Systems     Cardiovascular Unremarkable  Pulmonary Unremarkable  Neurological Unremarkable  Psychological Unremarkable  Gastrointestinal Hiatal hernia  Genitourinary Unremarkable  Hematological Unremarkable  Endocrine Diabetes mellitus Hypothyroidism  Rheumatological Osteoarthritis  Musculoskeletal Unremarkable  Other significant Ovarian cancer         Objective:   Physical Exam  There was tenderness of the splenius capitis occipitalis musculature regions palpation with reproduced mild discomfort. There was mild tenderness over the cervical facet cervical paraspinal muscles. There was mild tinnitus of the acromioclavicular and glenohumeral joint region. Patient appeared to be with bilaterally equal grip strength. Tinel and Phalen's maneuver were without increase of pain of significant degree. There appeared to be unremarkable Spurling's maneuver. There was tends to palpation over the thoracic facet thoracic paraspinal muscles of mild degree. There was mild tenderness over the upper and mid thoracic regions of moderate tends to palpation over the lower thoracic paraspinal musculature region. No crepitus of the thoracic region was noted. Palpation over the lumbar paraspinal muscles lumbar facet region associated with increased pain with tenderness over the lumbar facets of moderate to moderately severe degree. Lateral bending and rotation and extension and palpation of the lumbar facets reproduce moderately severe discomfort. There was moderate tenderness over the PSIS PII S region as well as the gluteal and piriformis musculature regions. Straight leg raising was tolerated to approximately 20 without a definite increased pain with dorsiflexion noted. There appeared to be negative  clonus negative Homans. No  definite sensory deficit of dermatomal distribution was detected There was negative clonus and negative Homans . Abdomen nontender no costovertebral angle tenderness noted.      Assessment & Plan:   Degenerative disc disease lumbar spine Status post L4-L5 fusion There is a defect in the superior endplate of L4 compatible with Schmorl's node  or focal compression fracture spondylosis at L3-4 causing moderate central canal narrowing worse on the left than the right with foraminal narrowing worse on the left than the right  Status post lumbar surgery  Lumbar facet syndrome  Sacroiliac joint dysfunction   PLAN   Continue present medication  Lumbar facet, medial branch nerve, blocks to be performed at time return appointment  F/U PCP Dr. Ramonita Lab for evaliation of  BP and general medical  condition  F/U surgical evaluation. May consider pending follow-up evaluations  F/U neurological evaluation. May consider pending follow-up evaluations  May consider radiofrequency rhizolysis or intraspinal procedures pending response to present treatment and F/U evaluation   Patient to call Pain Management Center should patient have concerns prior to scheduled return appointment.

## 2014-10-27 NOTE — Progress Notes (Signed)
Safety precautions to be maintained throughout the outpatient stay will include: orient to surroundings, keep bed in low position, maintain call bell within reach at all times, provide assistance with transfer out of bed and ambulation.  

## 2014-11-11 ENCOUNTER — Ambulatory Visit: Payer: Medicare Other | Attending: Anesthesiology | Admitting: Anesthesiology

## 2014-11-11 ENCOUNTER — Encounter: Payer: Self-pay | Admitting: Anesthesiology

## 2014-11-11 VITALS — BP 138/71 | HR 92 | Temp 97.7°F | Resp 18 | Ht 66.0 in | Wt 195.0 lb

## 2014-11-11 DIAGNOSIS — M545 Low back pain, unspecified: Secondary | ICD-10-CM

## 2014-11-11 DIAGNOSIS — M1611 Unilateral primary osteoarthritis, right hip: Secondary | ICD-10-CM | POA: Diagnosis not present

## 2014-11-11 DIAGNOSIS — M47816 Spondylosis without myelopathy or radiculopathy, lumbar region: Secondary | ICD-10-CM

## 2014-11-11 DIAGNOSIS — Z8543 Personal history of malignant neoplasm of ovary: Secondary | ICD-10-CM | POA: Insufficient documentation

## 2014-11-11 DIAGNOSIS — Z96641 Presence of right artificial hip joint: Secondary | ICD-10-CM

## 2014-11-11 DIAGNOSIS — M5136 Other intervertebral disc degeneration, lumbar region: Secondary | ICD-10-CM | POA: Insufficient documentation

## 2014-11-11 DIAGNOSIS — G8929 Other chronic pain: Secondary | ICD-10-CM | POA: Insufficient documentation

## 2014-11-11 DIAGNOSIS — M961 Postlaminectomy syndrome, not elsewhere classified: Secondary | ICD-10-CM

## 2014-11-11 DIAGNOSIS — M1288 Other specific arthropathies, not elsewhere classified, other specified site: Secondary | ICD-10-CM | POA: Diagnosis not present

## 2014-11-11 DIAGNOSIS — M533 Sacrococcygeal disorders, not elsewhere classified: Secondary | ICD-10-CM

## 2014-11-11 NOTE — Progress Notes (Signed)
Subjective:    Patient ID: Diana Hale, female    DOB: [birth year], 81 y.o.   MRN: [id number] This patient is a very pleasant delightful 81 year old lady who comes in with chronic low back pain. Patient indicates that the pain has been present for over 20 years and it is localized in her lower lumbar spine in the region of L4 and L5 She indicates that the pain does not radiate from the low back area She also complains of hip problems and has had a history of osteoarthritis of the right hip The patient indicated that she's had 2 back surgeries for ruptured disks in 2005 and 2009. She indicated that both surgical procedures give her adequate pain relief She indicates that her pain initially followed a series of falls that she had in the past Today her subjective pain intensity rating is 40% Her pain is relieved by sitting down by heating pads and pain medications Pain is aggravated by lifting and climbing stairs  Pain medication Patient takes hydrocodone 11/323 3 times a day and she takes oxycodone 20 mg at night She indicates that her pain medicine is prescribed from her PCP at the [facility]. She has taken MS Contin 30 mg and tramadol 50 mg in the past  Other medications Other medications include Astepro B complex folic acid: CholeCalciferol cyanocobalamin Flexeril insulin Synthroid Protonix MiraLAX Lovenox.  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 She's  also had right shoulder surgery for right rotator cuff disease and exploratory laparotomy for intestinal obstruction  Social and economic history Patient was a smoker for 15 years and at that time she smoke half a pack of cigarettes per day She discontinued smoking 50 years ago She does not use alcohol She does not use illicit drugs  Family history Patient is widowed and has been widowed for the past 36 years she is para 4+0. He is had 4 sons ages [id number] and 9 and they are all alive and wel Her mother is deceased at age 32 from the complications of uremia during childbirth  Her father is deceased at age 26 from tuberculosis She has one brother age is 83 who is alive and well  She has no sisters   she is retired and currently does genealogy research    HPI    Review of Systems  Constitutional: Negative.   [name]: Negative.   Eyes: Negative.   Respiratory: Negative.   Cardiovascular: Negative.   Gastrointestinal: Negative.        She's had intestinal obstruction which required exploratory laparotomy to correct that obstruction  Endocrine: Negative for cold intolerance, heat intolerance, polydipsia, polyphagia and polyuria.       Patient has a long-standing history of diabetes mellitus and she is afflicted by diabetic neuropathy affecting the feet  Genitourinary: Negative.   Musculoskeletal: Positive for myalgias, back pain, joint swelling, arthralgias and gait problem. Negative for neck pain and neck stiffness.       She has chronic low back pain secondary to 2 ruptured disks She's had 2 lumbar spine surgeries to correct that problem  and she's had a right total hip replacement  Allergic/Immunologic:       She has multiple allergies but the most severe one is her allergy to metoclopramide  Neurological: Negative for dizziness, tremors, seizures, syncope, facial asymmetry, speech difficulty, weakness, light-headedness, numbness and headaches.       She has severe diabetic  neuropathy affecting both feet  Hematological: Negative.   Psychiatric/Behavioral: Negative.        Objective:   Physical Exam  Constitutional: She appears well-developed and well-nourished. No distress.  [name]:  Head: Normocephalic and atraumatic.  Left Ear: External ear normal.  Nose: Nose normal.  Mouth/Throat: Oropharynx is clear and moist.  Eyes: Conjunctivae and EOM are normal. Pupils are equal, round, and reactive to light. Right eye exhibits no discharge. Left eye exhibits no discharge. No scleral icterus.  Neck: Normal range of motion. Neck supple. No JVD present. No tracheal deviation present. No thyromegaly present.  Cardiovascular: Normal rate, regular rhythm, normal heart sounds and intact distal pulses.  Exam reveals no gallop and no friction rub.   No murmur heard. Blood pressure was 138/71 mmHg Pulse was 92 bpm  Equal and regular Heart sounds 1 and 2 were heard in all areas There were no audible murmurs   Pulmonary/Chest: Effort normal and breath sounds normal. No respiratory distress. She has no wheezes. She has no rales. She exhibits no tenderness.  Respirations were 18 breaths per minute Temperature was 97.69F SPO2 was 98 Chest is clinically clear There were no adventitious sounds  Abdominal: Soft. Bowel sounds are normal. She exhibits no distension. There is no tenderness. There is no rebound and no guarding.  Genitourinary:  Genitourinary exam was deferred  Musculoskeletal: She exhibits no edema or tenderness.  Neurological evaluation showed that there was decreased range of motion in both lower extremities Straight-leg raising test on the right side was 40 Straight leg raising test on the left side was 80 Torsion tests was dramatically positive on the right side Neurological evaluation using light touch and pinprick was normal in both extremities  Lymphadenopathy:    She has no cervical adenopathy.  Neurological: She is alert. She has normal reflexes.  She displays normal reflexes. No cranial nerve deficit. She exhibits normal muscle tone. Coordination normal.  Skin: Skin is warm and dry. No rash noted. She is not diaphoretic. No erythema. No pallor.  Psychiatric: She has a normal mood and affect. Her behavior is normal. Judgment and thought content normal.  Nursing note and vitals reviewed.         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 Status post right total hip replaced 8 status post carcinoma of the ovary treated with chemotherapy and surgery   Plan of management 1 Will plan a caudal epidural steroid injection for her 2 Will contemplate a right lumbar facet medial branch nerve block for her 3 Will  consider percutaneous neuroplasty with hypertonic saline 4 Will ask her to bring a letter from her primary care physician's that he would not be providing any opioids so that I could continue taken over her management of the pain with the oral opioids   New patient     level [place].D.

## 2014-11-11 NOTE — Progress Notes (Signed)
Safety precautions to be maintained throughout the outpatient stay will include: orient to surroundings, keep bed in low position, maintain call bell within reach at all times, provide assistance with transfer out of bed and ambulation.  

## 2014-11-11 NOTE — Patient Instructions (Signed)
Epidural Steroid Injection Patient Information  Description: The epidural space surrounds the nerves as they exit the spinal cord.  In some patients, the nerves can be compressed and inflamed by a bulging disc or a tight spinal canal (spinal stenosis).  By injecting steroids into the epidural space, we can bring irritated nerves into direct contact with a potentially helpful medication.  These steroids act directly on the irritated nerves and can reduce swelling and inflammation which often leads to decreased pain.  Epidural steroids may be injected anywhere along the spine and from the neck to the low back depending upon the location of your pain.   After numbing the skin with local anesthetic (like Novocaine), a small needle is passed into the epidural space slowly.  You may experience a sensation of pressure while this is being done.  The entire block usually last less than 10 minutes.  Conditions which may be treated by epidural steroids:   Low back and leg pain  Neck and arm pain  Spinal stenosis  Post-laminectomy syndrome  Herpes zoster (shingles) pain  Pain from compression fractures  Preparation for the injection:  1. Do not eat any solid food or dairy products within 6 hours of your appointment.  2. You may drink clear liquids up to 2 hours before appointment.  Clear liquids include water, black coffee, juice or soda.  No milk or cream please. 3. You may take your regular medication, including pain medications, with a sip of water before your appointment  Diabetics should hold regular insulin (if taken separately) and take 1/2 normal NPH dos the morning of the procedure.  Carry some sugar containing items with you to your appointment. 4. A driver must accompany you and be prepared to drive you home after your procedure.  5. Bring all your current medications with your. 6. An IV may be inserted and sedation may be given at the discretion of the physician.   7. A blood pressure  cuff, EKG and other monitors will often be applied during the procedure.  Some patients may need to have extra oxygen administered for a short period. 8. You will be asked to provide medical information, including your allergies, prior to the procedure.  We must know immediately if you are taking blood thinners (like Coumadin/Warfarin)  Or if you are allergic to IV iodine contrast (dye). We must know if you could possible be pregnant.  Possible side-effects:  Bleeding from needle site  Infection (rare, may require surgery)  Nerve injury (rare)  Numbness & tingling (temporary)  Difficulty urinating (rare, temporary)  Spinal headache ( a headache worse with upright posture)  Light -headedness (temporary)  Pain at injection site (several days)  Decreased blood pressure (temporary)  Weakness in arm/leg (temporary)  Pressure sensation in back/neck (temporary)  Call if you experience:  Fever/chills associated with headache or increased back/neck pain.  Headache worsened by an upright position.  New onset weakness or numbness of an extremity below the injection site  Hives or difficulty breathing (go to the emergency room)  Inflammation or drainage at the infection site  Severe back/neck pain  Any new symptoms which are concerning to you  Please note:  Although the local anesthetic injected can often make your back or neck feel good for several hours after the injection, the pain will likely return.  It takes 3-7 days for steroids to work in the epidural space.  You may not notice any pain relief for at least that one week.    If effective, we will often do a series of three injections spaced 3-6 weeks apart to maximally decrease your pain.  After the initial series, we generally will wait several months before considering a repeat injection of the same type.  If you have any questions, please call [phone] [facility] 

## 2014-11-13 ENCOUNTER — Encounter: Payer: Self-pay | Admitting: Anesthesiology

## 2014-11-13 ENCOUNTER — Ambulatory Visit: Payer: Medicare Other | Attending: Anesthesiology | Admitting: Anesthesiology

## 2014-11-13 VITALS — BP 140/66 | HR 100 | Temp 98.7°F | Resp 16 | Ht 66.0 in | Wt 195.0 lb

## 2014-11-13 DIAGNOSIS — M5136 Other intervertebral disc degeneration, lumbar region: Secondary | ICD-10-CM

## 2014-11-13 DIAGNOSIS — M961 Postlaminectomy syndrome, not elsewhere classified: Secondary | ICD-10-CM | POA: Insufficient documentation

## 2014-11-13 DIAGNOSIS — M545 Low back pain, unspecified: Secondary | ICD-10-CM | POA: Insufficient documentation

## 2014-11-13 DIAGNOSIS — M5416 Radiculopathy, lumbar region: Secondary | ICD-10-CM | POA: Insufficient documentation

## 2014-11-13 DIAGNOSIS — G8929 Other chronic pain: Secondary | ICD-10-CM | POA: Diagnosis not present

## 2014-11-13 DIAGNOSIS — M47816 Spondylosis without myelopathy or radiculopathy, lumbar region: Secondary | ICD-10-CM

## 2014-11-13 MED ORDER — IOHEXOL 180 MG/ML  SOLN
INTRAMUSCULAR | Status: AC
  Administered 2014-11-13: 3 mL
  Filled 2014-11-13: qty 20

## 2014-11-13 MED ORDER — TRIAMCINOLONE ACETONIDE 40 MG/ML IJ SUSP
INTRAMUSCULAR | Status: AC
  Administered 2014-11-13: 40 mg
  Filled 2014-11-13: qty 1

## 2014-11-13 MED ORDER — BUPIVACAINE HCL (PF) 0.25 % IJ SOLN
INTRAMUSCULAR | Status: AC
  Administered 2014-11-13: 10 mL
  Filled 2014-11-13: qty 30

## 2014-11-13 MED ORDER — MIDAZOLAM HCL 5 MG/5ML IJ SOLN
INTRAMUSCULAR | Status: AC
  Administered 2014-11-13: 3 mg via INTRAVENOUS
  Filled 2014-11-13: qty 5

## 2014-11-13 NOTE — Patient Instructions (Signed)
Pain Management Discharge Instructions  General Discharge Instructions :  If you need to reach your doctor call: Monday-Friday 8:00 am - 4:00 pm at [phone] or toll free [phone].  After clinic hours [phone] to have operator reach doctor.  Bring all of your medication bottles to all your appointments in the pain clinic.  To cancel or reschedule your appointment with Pain Management please remember to call 24 hours in advance to avoid a fee.  Refer to the educational materials which you have been given on: General Risks, I had my Procedure. Discharge Instructions, Post Sedation.  Post Procedure Instructions:  The drugs you were given will stay in your system until tomorrow, so for the next 24 hours you should not drive, make any legal decisions or drink any alcoholic beverages.  You may eat anything you prefer, but it is better to start with liquids then soups and crackers, and gradually work up to solid foods.  Please notify your doctor immediately if you have any unusual bleeding, trouble breathing or pain that is not related to your normal pain.  Depending on the type of procedure that was done, some parts of your body may feel week and/or numb.  This usually clears up by tonight or the next day.  Walk with the use of an assistive device or accompanied by an adult for the 24 hours.  You may use ice on the affected area for the first 24 hours.  Put ice in a Ziploc bag and cover with a towel and place against area 15 minutes on 15 minutes off.  You may switch to heat after 24 hours.Epidural Steroid Injection An epidural steroid injection is given to relieve pain in your neck, back, or legs that is caused by the irritation or swelling of a nerve root. This procedure involves injecting a steroid and numbing medicine (anesthetic) into the epidural space. The epidural space is the space between the outer covering of your spinal cord and the bones that form your backbone  (vertebra).  LET YOUR HEALTH CARE PROVIDER KNOW ABOUT:   Any allergies you have.  All medicines you are taking, including vitamins, herbs, eye drops, creams, and over-the-counter medicines such as aspirin.  Previous problems you or members of your family have had with the use of anesthetics.  Any blood disorders or blood clotting disorders you have.  Previous surgeries you have had.  Medical conditions you have. RISKS AND COMPLICATIONS Generally, this is a safe procedure. However, as with any procedure, complications can occur. Possible complications of epidural steroid injection include:  Headache.  Bleeding.  Infection.  Allergic reaction to the medicines.  Damage to your nerves. The response to this procedure depends on the underlying cause of the pain and its duration. People who have long-term (chronic) pain are less likely to benefit from epidural steroids than are those people whose pain comes on strong and suddenly. BEFORE THE PROCEDURE   Ask your health care provider about changing or stopping your regular medicines. You may be advised to stop taking blood-thinning medicines a few days before the procedure.  You may be given medicines to reduce anxiety.  Arrange for someone to take you home after the procedure. PROCEDURE   You will remain awake during the procedure. You may receive medicine to make you relaxed.  You will be asked to lie on your stomach.  The injection site will be cleaned.  The injection site will be numbed with a medicine (local anesthetic).  A needle will be   injected through your skin into the epidural space.  Your health care provider will use an X-ray machine to ensure that the steroid is delivered closest to the affected nerve. You may have minimal discomfort at this time.  Once the needle is in the right position, the local anesthetic and the steroid will be injected into the epidural space.  The needle will then be removed and a  bandage will be applied to the injection site. AFTER THE PROCEDURE   You may be monitored for a short time before you go home.  You may feel weakness or numbness in your arm or leg, which disappears within hours.  You may be allowed to eat, drink, and take your regular medicine.  You may have soreness at the site of the injection. Document Released: 05/09/2007 Document Revised: 10/02/2012 Document Reviewed: 07/19/2012 [facility] Patient Information 2015 [place], [place]. This information is not intended to replace advice given to you by your health care provider. Make sure you discuss any questions you have with your health care provider.

## 2014-11-13 NOTE — Procedures (Signed)
Date of procedure: 11/13/2014  Preoperative Diagnosis:  1 chronic low back pain 2 lumbar degenerative disc disease 3 lumbar radiculopathy 4. Back surgery syndrome  Postoperative Diagnosis:  Same.  Procedure: 1. Caudal epidural steroid injection, 2. Epidural with interpretation. 3. Fluoroscopic guidance.  Surgeon: Lance Bosch, MD  Anesthesia: MAC anesthesia by the nurse and staff under my direction.  Informed consent was obtained and the patient appeared to accept and understand the benefits and risks of this procedure.   Pre procedure comments:  Intravenous sedation with Versed was used but no fentanyl was used since patient is allergic to fentanyl  Description of the Procedure:  The patient was taken to the operating room and placed in the prone position.  Intravenous sedation and MAC anesthesia was administered by the nurse and staff under my direction. . After appropriate sedation, the sacrococcygeal area was prepped with Betadine.  After adequate draping, the area between the sacral cornu was palpated and infiltrated with 3 cc of 1% Lidocaine.   An AP fluoroscopic view of the sacrum was visualized and a 17 gauge Tuohy needle was inserted in the midline at the angle of 45 degrees through the sacrococcygeal membrane.  After making contact with the bone, the needle was withdrawn and readvanced in horizontal position, into the caudal epidural space.  Epidurogram Study: After contrast media was introduced into the epidural space via had the caudal area, the contrast was observed to spread in a cephalad direction. The spread was seen to extend through S1 L5 L4 and L3 The spread was very clear on the left side but it was restricted on the right side There appeared to be some neuroforaminal stenosis particularly at L5-S1 S1  One cc of Omnipaque 300 was injected through the needle and epidurogram was visualized in both the later and AP views.  Comments:   This procedure was  done using fluoroscopic guidance . Fluoroscopic time was 0.4 minutes MG Y was 6.2 No catheter was used.  Caudal Epidural Steroid Injection:  Then 10 cc of 0.25% Bupivacaine and 80 mg of Kenalog were injected into the Caudal epidural space.   The needle was removed and adequate hemostasis was established.    The patient tolerated the procedure quite well and vital signs were stable.   There were no adverse effects.  Additional comments:   None  The patient was taken to the recovery room in satisfactory condition where the patient was observed and subsequently discharged home.   Will follow up in the clinic in the next week.   Lance Bosch M.D.

## 2014-11-13 NOTE — Progress Notes (Signed)
Safety precautions to be maintained throughout the outpatient stay will include: orient to surroundings, keep bed in low position, maintain call bell within reach at all times, provide assistance with transfer out of bed and ambulation.  

## 2014-11-16 ENCOUNTER — Telehealth: Payer: Self-pay | Admitting: *Deleted

## 2014-11-16 NOTE — Telephone Encounter (Signed)
Patient verbalizes no complications from procedure.  

## 2014-11-24 ENCOUNTER — Other Ambulatory Visit: Payer: Self-pay | Admitting: Anesthesiology

## 2014-11-25 ENCOUNTER — Encounter: Payer: Self-pay | Admitting: Anesthesiology

## 2014-11-25 ENCOUNTER — Ambulatory Visit: Payer: Medicare Other | Attending: Anesthesiology | Admitting: Anesthesiology

## 2014-11-25 VITALS — BP 137/51 | HR 87 | Temp 98.4°F | Resp 18 | Ht 66.0 in | Wt 195.0 lb

## 2014-11-25 DIAGNOSIS — M545 Low back pain, unspecified: Secondary | ICD-10-CM

## 2014-11-25 DIAGNOSIS — M961 Postlaminectomy syndrome, not elsewhere classified: Secondary | ICD-10-CM

## 2014-11-25 DIAGNOSIS — M5136 Other intervertebral disc degeneration, lumbar region: Secondary | ICD-10-CM | POA: Diagnosis not present

## 2014-11-25 DIAGNOSIS — M1288 Other specific arthropathies, not elsewhere classified, other specified site: Secondary | ICD-10-CM | POA: Diagnosis not present

## 2014-11-25 DIAGNOSIS — M47816 Spondylosis without myelopathy or radiculopathy, lumbar region: Secondary | ICD-10-CM

## 2014-11-25 DIAGNOSIS — M5416 Radiculopathy, lumbar region: Secondary | ICD-10-CM | POA: Insufficient documentation

## 2014-11-25 DIAGNOSIS — G8929 Other chronic pain: Secondary | ICD-10-CM | POA: Diagnosis not present

## 2014-11-25 MED ORDER — HYDROCODONE-ACETAMINOPHEN 10-325 MG PO TABS: 1.0000 | ORAL_TABLET | Freq: Three times a day (TID) | ORAL | Status: DC

## 2014-11-25 MED ORDER — HYDROCODONE-ACETAMINOPHEN 10-325 MG PO TABS: 1.0000 | ORAL_TABLET | Freq: Three times a day (TID) | ORAL | Status: AC | PRN

## 2014-11-25 NOTE — Progress Notes (Signed)
Safety precautions to be maintained throughout the outpatient stay will include: orient to surroundings, keep bed in low position, maintain call bell within reach at all times, provide assistance with transfer out of bed and ambulation.  

## 2014-11-25 NOTE — Patient Instructions (Signed)
GENERAL RISKS AND COMPLICATIONS  What are the risk, side effects and possible complications? Generally speaking, most procedures are safe.  However, with any procedure there are risks, side effects, and the possibility of complications.  The risks and complications are dependent upon the sites that are lesioned, or the type of nerve block to be performed.  The closer the procedure is to the spine, the more serious the risks are.  Great care is taken when placing the radio frequency needles, block needles or lesioning probes, but sometimes complications can occur. 1. Infection: Any time there is an injection through the skin, there is a risk of infection.  This is why sterile conditions are used for these blocks.  There are four possible types of infection. 1. Localized skin infection. 2. Central Nervous System Infection-This can be in the form of Meningitis, which can be deadly. 3. Epidural Infections-This can be in the form of an epidural abscess, which can cause pressure inside of the spine, causing compression of the spinal cord with subsequent paralysis. This would require an emergency surgery to decompress, and there are no guarantees that the patient would recover from the paralysis. 4. Discitis-This is an infection of the intervertebral discs.  It occurs in about 1% of discography procedures.  It is difficult to treat and it may lead to surgery.        2. Pain: the needles have to go through skin and soft tissues, will cause soreness.       3. Damage to internal structures:  The nerves to be lesioned may be near blood vessels or    other nerves which can be potentially damaged.       4. Bleeding: Bleeding is more common if the patient is taking blood thinners such as  aspirin, Coumadin, Ticiid, Plavix, etc., or if he/she have some genetic predisposition  such as hemophilia. Bleeding into the spinal canal can cause compression of the spinal  cord with subsequent paralysis.  This would require an  emergency surgery to  decompress and there are no guarantees that the patient would recover from the  paralysis.       5. Pneumothorax:  Puncturing of a lung is a possibility, every time a needle is introduced in  the area of the chest or upper back.  Pneumothorax refers to free air around the  collapsed lung(s), inside of the thoracic cavity (chest cavity).  Another two possible  complications related to a similar event would include: Hemothorax and Chylothorax.   These are variations of the Pneumothorax, where instead of air around the collapsed  lung(s), you may have blood or chyle, respectively.       6. Spinal headaches: They may occur with any procedures in the area of the spine.       7. Persistent CSF (Cerebro-Spinal Fluid) leakage: This is a rare problem, but may occur  with prolonged intrathecal or epidural catheters either due to the formation of a fistulous  track or a dural tear.       8. Nerve damage: By working so close to the spinal cord, there is always a possibility of  nerve damage, which could be as serious as a permanent spinal cord injury with  paralysis.       9. Death:  Although rare, severe deadly allergic reactions known as "Anaphylactic  reaction" can occur to any of the medications used.      10. Worsening of the symptoms:  We can always make thing worse.    What are the chances of something like this happening? Chances of any of this occuring are extremely low.  By statistics, you have more of a chance of getting killed in a motor vehicle accident: while driving to the hospital than any of the above occurring .  Nevertheless, you should be aware that they are possibilities.  In general, it is similar to taking a shower.  Everybody knows that you can slip, hit your head and get killed.  Does that mean that you should not shower again?  Nevertheless always keep in mind that statistics do not mean anything if you happen to be on the wrong side of them.  Even if a procedure has a 1  (one) in a 1,000,000 (million) chance of going wrong, it you happen to be that one..Also, keep in mind that by statistics, you have more of a chance of having something go wrong when taking medications.  Who should not have this procedure? If you are on a blood thinning medication (e.g. Coumadin, Plavix, see list of "Blood Thinners"), or if you have an active infection going on, you should not have the procedure.  If you are taking any blood thinners, please inform your physician.  How should I prepare for this procedure?  Do not eat or drink anything at least six hours prior to the procedure.  Bring a driver with you .  It cannot be a taxi.  Come accompanied by an adult that can drive you back, and that is strong enough to help you if your legs get weak or numb from the local anesthetic.  Take all of your medicines the morning of the procedure with just enough water to swallow them.  If you have diabetes, make sure that you are scheduled to have your procedure done first thing in the morning, whenever possible.  If you have diabetes, take only half of your insulin dose and notify our nurse that you have done so as soon as you arrive at the clinic.  If you are diabetic, but only take blood sugar pills (oral hypoglycemic), then do not take them on the morning of your procedure.  You may take them after you have had the procedure.  Do not take aspirin or any aspirin-containing medications, at least eleven (11) days prior to the procedure.  They may prolong bleeding.  Wear loose fitting clothing that may be easy to take off and that you would not mind if it got stained with Betadine or blood.  Do not wear any jewelry or perfume  Remove any nail coloring.  It will interfere with some of our monitoring equipment.  NOTE: Remember that this is not meant to be interpreted as a complete list of all possible complications.  Unforeseen problems may occur.  BLOOD THINNERS The following drugs  contain aspirin or other products, which can cause increased bleeding during surgery and should not be taken for 2 weeks prior to and 1 week after surgery.  If you should need take something for relief of minor pain, you may take acetaminophen which is found in Tylenol,m Datril, Anacin-3 and Panadol. It is not blood thinner. The products listed below are.  Do not take any of the products listed below in addition to any listed on your instruction sheet.  A.P.C or A.P.C with Codeine Codeine Phosphate Capsules #3 Ibuprofen Ridaura  ABC compound Congesprin Imuran rimadil  Advil Cope Indocin Robaxisal  Alka-Seltzer Effervescent Pain Reliever and Antacid Coricidin or Coricidin-D  Indomethacin Rufen    Alka-Seltzer plus Cold Medicine Cosprin Ketoprofen S-A-C Tablets  Anacin Analgesic Tablets or Capsules Coumadin Korlgesic Salflex  Anacin Extra Strength Analgesic tablets or capsules CP-2 Tablets Lanoril Salicylate  Anaprox Cuprimine Capsules Levenox Salocol  Anexsia-D Dalteparin Magan Salsalate  Anodynos Darvon compound Magnesium Salicylate Sine-off  Ansaid Dasin Capsules Magsal Sodium Salicylate  Anturane Depen Capsules Marnal Soma  APF Arthritis pain formula Dewitt's Pills Measurin Stanback  Argesic Dia-Gesic Meclofenamic Sulfinpyrazone  Arthritis Bayer Timed Release Aspirin Diclofenac Meclomen Sulindac  Arthritis pain formula Anacin Dicumarol Medipren Supac  Analgesic (Safety coated) Arthralgen Diffunasal Mefanamic Suprofen  Arthritis Strength Bufferin Dihydrocodeine Mepro Compound Suprol  Arthropan liquid Dopirydamole Methcarbomol with Aspirin Synalgos  ASA tablets/Enseals Disalcid Micrainin Tagament  Ascriptin Doan's Midol Talwin  Ascriptin A/D Dolene Mobidin Tanderil  Ascriptin Extra Strength Dolobid Moblgesic Ticlid  Ascriptin with Codeine Doloprin or Doloprin with Codeine Momentum Tolectin  Asperbuf Duoprin Mono-gesic Trendar  Aspergum Duradyne Motrin or Motrin IB Triminicin  Aspirin  plain, buffered or enteric coated Durasal Myochrisine Trigesic  Aspirin Suppositories Easprin Nalfon Trillsate  Aspirin with Codeine Ecotrin Regular or Extra Strength Naprosyn Uracel  Atromid-S Efficin Naproxen Ursinus  Auranofin Capsules Elmiron Neocylate Vanquish  Axotal Emagrin Norgesic Verin  Azathioprine Empirin or Empirin with Codeine Normiflo Vitamin E  Azolid Emprazil Nuprin Voltaren  Bayer Aspirin plain, buffered or children's or timed BC Tablets or powders Encaprin Orgaran Warfarin Sodium  Buff-a-Comp Enoxaparin Orudis Zorpin  Buff-a-Comp with Codeine Equegesic Os-Cal-Gesic   Buffaprin Excedrin plain, buffered or Extra Strength Oxalid   Bufferin Arthritis Strength Feldene Oxphenbutazone   Bufferin plain or Extra Strength Feldene Capsules Oxycodone with Aspirin   Bufferin with Codeine Fenoprofen Fenoprofen Pabalate or Pabalate-SF   Buffets II Flogesic Panagesic   Buffinol plain or Extra Strength Florinal or Florinal with Codeine Panwarfarin   Buf-Tabs Flurbiprofen Penicillamine   Butalbital Compound Four-way cold tablets Penicillin   Butazolidin Fragmin Pepto-Bismol   Carbenicillin Geminisyn Percodan   Carna Arthritis Reliever Geopen Persantine   Carprofen Gold's salt Persistin   Chloramphenicol Goody's Phenylbutazone   Chloromycetin Haltrain Piroxlcam   Clmetidine heparin Plaquenil   Cllnoril Hyco-pap Ponstel   Clofibrate Hydroxy chloroquine Propoxyphen         Before stopping any of these medications, be sure to consult the physician who ordered them.  Some, such as Coumadin (Warfarin) are ordered to prevent or treat serious conditions such as "deep thrombosis", "pumonary embolisms", and other heart problems.  The amount of time that you may need off of the medication may also vary with the medication and the reason for which you were taking it.  If you are taking any of these medications, please make sure you notify your pain physician before you undergo any  procedures.         Facet Blocks Patient Information  Description: The facets are joints in the spine between the vertebrae.  Like any joints in the body, facets can become irritated and painful.  Arthritis can also effect the facets.  By injecting steroids and local anesthetic in and around these joints, we can temporarily block the nerve supply to them.  Steroids act directly on irritated nerves and tissues to reduce selling and inflammation which often leads to decreased pain.  Facet blocks may be done anywhere along the spine from the neck to the low back depending upon the location of your pain.   After numbing the skin with local anesthetic (like Novocaine), a small needle is passed onto the facet joints under x-ray guidance.    You may experience a sensation of pressure while this is being done.  The entire block usually lasts about 15-25 minutes.   Conditions which may be treated by facet blocks:   Low back/buttock pain  Neck/shoulder pain  Certain types of headaches  Preparation for the injection:  1. Do not eat any solid food or dairy products within 6 hours of your appointment. 2. You may drink clear liquid up to 2 hours before appointment.  Clear liquids include water, black coffee, juice or soda.  No milk or cream please. 3. You may take your regular medication, including pain medications, with a sip of water before your appointment.  Diabetics should hold regular insulin (if taken separately) and take 1/2 normal NPH dose the morning of the procedure.  Carry some sugar containing items with you to your appointment. 4. A driver must accompany you and be prepared to drive you home after your procedure. 5. Bring all your current medications with you. 6. An IV may be inserted and sedation may be given at the discretion of the physician. 7. A blood pressure cuff, EKG and other monitors will often be applied during the procedure.  Some patients may need to have extra oxygen  administered for a short period. 8. You will be asked to provide medical information, including your allergies and medications, prior to the procedure.  We must know immediately if you are taking blood thinners (like Coumadin/Warfarin) or if you are allergic to IV iodine contrast (dye).  We must know if you could possible be pregnant.  Possible side-effects:   Bleeding from needle site  Infection (rare, may require surgery)  Nerve injury (rare)  Numbness & tingling (temporary)  Difficulty urinating (rare, temporary)  Spinal headache (a headache worse with upright posture)  Light-headedness (temporary)  Pain at injection site (serveral days)  Decreased blood pressure (rare, temporary)  Weakness in arm/leg (temporary)  Pressure sensation in back/neck (temporary)   Call if you experience:   Fever/chills associated with headache or increased back/neck pain  Headache worsened by an upright position  New onset, weakness or numbness of an extremity below the injection site  Hives or difficulty breathing (go to the emergency room)  Inflammation or drainage at the injection site(s)  Severe back/neck pain greater than usual  New symptoms which are concerning to you  Please note:  Although the local anesthetic injected can often make your back or neck feel good for several hours after the injection, the pain will likely return. It takes 3-7 days for steroids to work.  You may not notice any pain relief for at least one week.  If effective, we will often do a series of 2-3 injections spaced 3-6 weeks apart to maximally decrease your pain.  After the initial series, you may be a candidate for a more permanent nerve block of the facets.  If you have any questions, please call [phone]  [facility] 

## 2014-11-25 NOTE — Progress Notes (Signed)
   Subjective:    Patient ID: Diana Hale, female    DOB: 1931/03/14, 81 y.o.   MRN: [id number]  HPI Diana Hale had a caudal epidural steroid injection one week ago and she indicated that she was pain-free for several days She stopped taking her medications and there was a gradual reoccurrence of her pain but not to do pre-procedure level Today her subjective pain intensity rating is 40% She has been taking multiple opioid medications and I discussed with her that in my opinion that these were excessive particularly for and 81 year old lady I explained the risk of central nervous system depression and respiratory depression with opioids and she appeared to accept and understand those warnings She is eager to proceed with the second procedure which is a right lumbar medial branch facet block She is to bring the letter from her primary care physician who was prescribing opioids for her that he would not be doing so anymore. This patient saw Dr. Marquette Saa crisp a few weeks ago and opted not to continue seeing him for personal reasons [name] she has switched over to my care    Review of Systems  Constitutional: Negative.   [name]: Negative.   Eyes: Negative.   Respiratory: Negative.   Cardiovascular: Negative.   Gastrointestinal: Negative.   Endocrine: Negative.   Genitourinary: Negative.   Musculoskeletal: Negative.   Skin: Negative.   Allergic/Immunologic: Negative.   Hematological: Negative.   Psychiatric/Behavioral: Negative.        Objective:   Physical Exam  Constitutional: She is oriented to person, place, and time. She appears well-developed. No distress.  [name]:  Head: Normocephalic and atraumatic.  Right Ear: External ear normal.  Left Ear: External ear normal.  Nose: Nose normal.  Mouth/Throat: Oropharynx is clear and moist.  Eyes: Conjunctivae and EOM are normal. Pupils are equal, round, and reactive to light. Right eye exhibits no discharge. Left eye exhibits no  discharge. No scleral icterus.  Neck: No JVD present. No tracheal deviation present. No thyromegaly present.  Cardiovascular: Normal rate, regular rhythm and intact distal pulses.  Exam reveals no gallop and no friction rub.   No murmur heard. Genitourinary:  Genitourinary exam was deferred  Musculoskeletal: She exhibits no edema or tenderness.  Patient continues to have some tenderness in the right paraspinous lumbar area but the tenderness is significantly decreased compared to the initial evaluation visit  Lymphadenopathy:    She has no cervical adenopathy.  Neurological: She is alert and oriented to person, place, and time. She has normal reflexes. She displays normal reflexes. No cranial nerve deficit. She exhibits normal muscle tone. Coordination normal.  Skin: Skin is warm and dry. No rash noted. She is not diaphoretic. No erythema. No pallor.  Psychiatric: She has a normal mood and affect. Her behavior is normal. Judgment normal.  Nursing note and vitals reviewed.         Assessment & Plan:    Assessment 1 chronic low back pain 2 lumbar degenerative disc disease 3 lumbar radiculopathy 4 lumbar facet arthropathy   Plan of management We'll begin the patient on not: 10 mg/325 one tablets every 8 hours when necessary and will give her 42 pills Will also plan to perform a right lumbar medial branch facet block at L2-L3 L4-L5 sacral ala and S1 for her We'll plan to do this in the next 9 days   Established patient    level III    Lance Bosch M.D.

## 2014-11-26 ENCOUNTER — Telehealth: Payer: Self-pay | Admitting: Anesthesiology

## 2014-11-26 NOTE — Telephone Encounter (Signed)
Patient advised to take meds as prescribed.

## 2014-11-26 NOTE — Telephone Encounter (Signed)
Pt left vmail for Dena / Phys cannot give her script / can she get original meds she was taking ? [phone]

## 2014-11-30 ENCOUNTER — Telehealth: Payer: Self-pay | Admitting: Anesthesiology

## 2014-11-30 NOTE — Telephone Encounter (Signed)
Spoke with patient,  She states that Dr Sharlet Salina office faxed over that they are no longer prescribing for her to Dr Primus Bravo.  Dr Idelia Salm asked her to have something with this same statement faxed to him specifically.  Told patient we would look for that fax.

## 2014-11-30 NOTE — Telephone Encounter (Signed)
Patient called Dr. Sharlet Salina office saying she needed them to fax something saying they were not giving her anymore medications / which they are not/ do we need this from them

## 2014-12-04 ENCOUNTER — Ambulatory Visit: Payer: Medicare Other | Admitting: Anesthesiology

## 2014-12-11 ENCOUNTER — Ambulatory Visit: Payer: Medicare Other | Attending: Anesthesiology | Admitting: Anesthesiology

## 2014-12-11 ENCOUNTER — Encounter: Payer: Self-pay | Admitting: Anesthesiology

## 2014-12-11 VITALS — BP 156/63 | HR 83 | Temp 98.5°F | Resp 16 | Ht 66.0 in | Wt 195.0 lb

## 2014-12-11 DIAGNOSIS — G8929 Other chronic pain: Secondary | ICD-10-CM | POA: Diagnosis present

## 2014-12-11 DIAGNOSIS — M5136 Other intervertebral disc degeneration, lumbar region: Secondary | ICD-10-CM | POA: Diagnosis present

## 2014-12-11 DIAGNOSIS — M961 Postlaminectomy syndrome, not elsewhere classified: Secondary | ICD-10-CM

## 2014-12-11 DIAGNOSIS — M545 Low back pain, unspecified: Secondary | ICD-10-CM

## 2014-12-11 DIAGNOSIS — M47816 Spondylosis without myelopathy or radiculopathy, lumbar region: Secondary | ICD-10-CM

## 2014-12-11 MED ORDER — MIDAZOLAM HCL 5 MG/5ML IJ SOLN
INTRAMUSCULAR | Status: AC
  Filled 2014-12-11: qty 5

## 2014-12-11 MED ORDER — TRIAMCINOLONE ACETONIDE 40 MG/ML IJ SUSP
INTRAMUSCULAR | Status: AC
  Filled 2014-12-11: qty 2

## 2014-12-11 MED ORDER — BUPIVACAINE HCL (PF) 0.5 % IJ SOLN
INTRAMUSCULAR | Status: AC
  Filled 2014-12-11: qty 30

## 2014-12-11 MED ORDER — IOHEXOL 180 MG/ML  SOLN
INTRAMUSCULAR | Status: AC
  Administered 2014-12-11: 11:00:00
  Filled 2014-12-11: qty 20

## 2014-12-11 MED ORDER — TRIAMCINOLONE ACETONIDE 40 MG/ML IJ SUSP
INTRAMUSCULAR | Status: AC
  Administered 2014-12-11: 11:00:00
  Filled 2014-12-11: qty 2

## 2014-12-11 MED ORDER — MIDAZOLAM HCL 5 MG/5ML IJ SOLN
INTRAMUSCULAR | Status: AC
  Administered 2014-12-11: 2 mg via INTRAVENOUS
  Filled 2014-12-11: qty 5

## 2014-12-11 MED ORDER — BUPIVACAINE HCL (PF) 0.25 % IJ SOLN
INTRAMUSCULAR | Status: AC
  Administered 2014-12-11: 11:00:00
  Filled 2014-12-11: qty 30

## 2014-12-11 MED ORDER — BUPIVACAINE HCL (PF) 0.25 % IJ SOLN
INTRAMUSCULAR | Status: AC
  Filled 2014-12-11: qty 30

## 2014-12-11 MED ORDER — HYDROCODONE-ACETAMINOPHEN 10-325 MG PO TABS: 1.0000 | ORAL_TABLET | Freq: Three times a day (TID) | ORAL | Status: DC

## 2014-12-11 NOTE — Procedures (Signed)
Date of procedure: 12/11/2014  Preoperative Diagnosis:  1 chronic low back pain 2 lumbar degenerative disc disease 3 lumbar facet arthropathy  Postoperative Diagnosis: Same.  Procedure: 1. right lumbosacral facet medial branch nerve block injections at L2, L3, L4, L5, Sacral ala, and S1 2. Contrast media injection under fluoroscopic visualization for needle placement and spread of medication verification.  Surgeon: Lance Bosch, MD  Anesthesia: MAC anesthesia by the nurse and staff under my direction  Informed consent was obtained and the patient appeared to accept and understand the benefits and risks of this procedure.   Pre procedure comments:  None  Description of the Procedure:  The patient was taken to the operating room and placed in the prone position.  Intravenous sedation and MAC anesthesia was administered by the nurse and staff under my direction  After appropriate sedation the lumbosacral area was prepped with Betadine and sterile draping was applied.  Using right oblique fluoroscopic view the lumbar facets, superior articular cartilage, and transverse process of L2, L3, L4 and L5 were visualized.  The target areas for local infiltration and needle placement were the junction of the superior articular cartilage and the transverse process.  These target areas at L2, L3, L4 and L5 were infiltrated with 2 cc of 1% Lidocaine using a #25 gauge needle.  Then four #22 gauge 3 inch needles were directed to the junction of the superior articular cartilage and the transverse process on the right side.  Lateral fluoroscopic views were used to confirm needle placement.  After appropriate needle positioning, 0.5 cc of Omnipaque 300 was injected at each level so that documentation of the absence of intravascular uptake could be established. This maneuver also served to indicate that there was adequate spread of medication.  Then 1 cc of 0.5% Bupivacaine and 3 mg of Celestone were  injected at L2, L3, L4 and L5.  Post injection fluoroscopy showed there was modest washout of the contrast media.  The procedures were repeated at the right sacral ala and also at the S1, 10 o'clock/2 o'clock position in the neural foramen.  0.5 cc of Omnipaque 300 was injected over the sacral ala and S1 foramen.  No intravascular update was observed.  The 1 cc of Bupivacaine 0.5% and 15 mg of Kenalog were injected at both the sacral ala and the S1 foramen.   Both needles were removed, adequate hemostasis was achieved.   Patient tolerated the procedure quite well; the vital signs were stable ; there were no adverse reactions.  Additional Comments:  None  The patient was taken to the recovery room in satisfactory condition where the patient was observed, and subsequently discharged home.   Will follow up with patient in the next week.  Lance Bosch M.D.

## 2014-12-11 NOTE — Progress Notes (Signed)
Safety precautions to be maintained throughout the outpatient stay will include: orient to surroundings, keep bed in low position, maintain call bell within reach at all times, provide assistance with transfer out of bed and ambulation.  

## 2014-12-11 NOTE — Patient Instructions (Signed)
Facet Joint Block The facet joints connect the bones of the spine (vertebrae). They make it possible for you to bend, twist, and make other movements with your spine. They also prevent you from overbending, overtwisting, and making other excessive movements.  A facet joint block is a procedure where a numbing medicine (anesthetic) is injected into a facet joint. Often, a type of anti-inflammatory medicine called a steroid is also injected. A facet joint block may be done for two reasons:   Diagnosis. A facet joint block may be done as a test to see whether neck or back pain is caused by a worn-down or infected facet joint. If the pain gets better after a facet joint block, it means the pain is probably coming from the facet joint. If the pain does not get better, it means the pain is probably not coming from the facet joint.   Therapy. A facet joint block may be done to relieve neck or back pain caused by a facet joint. A facet joint block is only done as a therapy if the pain does not improve with medicine, exercise programs, physical therapy, and other forms of pain management. LET YOUR HEALTH CARE PROVIDER KNOW ABOUT:   Any allergies you have.   All medicines you are taking, including vitamins, herbs, eyedrops, and over-the-counter medicines and creams.   Previous problems you or members of your family have had with the use of anesthetics.   Any blood disorders you have had.   Other health problems you have. RISKS AND COMPLICATIONS Generally, having a facet joint block is safe. However, as with any procedure, complications can occur. Possible complications associated with having a facet joint block include:   Bleeding.   Injury to a nerve near the injection site.   Pain at the injection site.   Weakness or numbness in areas controlled by nerves near the injection site.   Infection.   Temporary fluid retention.   Allergic reaction to anesthetics or medicines used during  the procedure. BEFORE THE PROCEDURE   Follow your health care provider's instructions if you are taking dietary supplements or medicines. You may need to stop taking them or reduce your dosage.   Do not take any new dietary supplements or medicines without asking your health care provider first.   Follow your health care provider's instructions about eating and drinking before the procedure. You may need to stop eating and drinking several hours before the procedure.   Arrange to have an adult drive you home after the procedure. PROCEDURE  You may need to remove your clothing and dress in an open-back gown so that your health care provider can access your spine.   The procedure will be done while you are lying on an X-ray table. Most of the time you will be asked to lie on your stomach, but you may be asked to lie in a different position if an injection will be made in your neck.   Special machines will be used to monitor your oxygen levels, heart rate, and blood pressure.   If an injection will be made in your neck, an intravenous (IV) tube will be inserted into one of your veins. Fluids and medicine will flow directly into your body through the IV tube.   The area over the facet joint where the injection will be made will be cleaned with an antiseptic soap. The surrounding skin will be covered with sterile drapes.   An anesthetic will be applied to your skin   to make the injection area numb. You may feel a temporary stinging or burning sensation.   A video X-ray machine will be used to locate the joint. A contrast dye may be injected into the facet joint area to help with locating the joint.   When the joint is located, an anesthetic medicine will be injected into the joint through the needle.   Your health care provider will ask you whether you feel pain relief. If you do feel relief, a steroid may be injected to provide pain relief for a longer period of time. If you do not  feel relief or feel only partial relief, additional injections of an anesthetic may be made in other facet joints.   The needle will be removed, the skin will be cleansed, and bandages will be applied.  AFTER THE PROCEDURE   You will be observed for 15-30 minutes before being allowed to go home. Do not drive. Have an adult drive you or take a taxi or public transportation instead.   If you feel pain relief, the pain will return in several hours or days when the anesthetic wears off.   You may feel pain relief 2-14 days after the procedure. The amount of time this relief lasts varies from person to person.   It is normal to feel some tenderness over the injected area(s) for 2 days following the procedure.   If you have diabetes, you may have a temporary increase in blood sugar.   This information is not intended to replace advice given to you by your health care provider. Make sure you discuss any questions you have with your health care provider.   Document Released: 06/21/2006 Document Revised: 02/20/2014 Document Reviewed: 11/20/2011 Elsevier Interactive Patient Education 2016 [employer] Pain Management Discharge Instructions  General Discharge Instructions :  If you need to reach your doctor call: Monday-Friday 8:00 am - 4:00 pm at [phone] or toll free [phone].  After clinic hours [phone] to have operator reach doctor.  Bring all of your medication bottles to all your appointments in the pain clinic.  To cancel or reschedule your appointment with Pain Management please remember to call 24 hours in advance to avoid a fee.  Refer to the educational materials which you have been given on: General Risks, I had my Procedure. Discharge Instructions, Post Sedation.  Post Procedure Instructions:  The drugs you were given will stay in your system until tomorrow, so for the next 24 hours you should not drive, make any legal decisions or drink any alcoholic  beverages.  You may eat anything you prefer, but it is better to start with liquids then soups and crackers, and gradually work up to solid foods.  Please notify your doctor immediately if you have any unusual bleeding, trouble breathing or pain that is not related to your normal pain.  Depending on the type of procedure that was done, some parts of your body may feel week and/or numb.  This usually clears up by tonight or the next day.  Walk with the use of an assistive device or accompanied by an adult for the 24 hours.  You may use ice on the affected area for the first 24 hours.  Put ice in a Ziploc bag and cover with a towel and place against area 15 minutes on 15 minutes off.  You may switch to heat after 24 hours. 

## 2014-12-14 ENCOUNTER — Telehealth: Payer: Self-pay | Admitting: *Deleted

## 2014-12-14 NOTE — Telephone Encounter (Signed)
Left voicemail to call our office if she has problems or concerns following procedure.

## 2014-12-23 ENCOUNTER — Ambulatory Visit: Payer: Medicare Other | Attending: Anesthesiology | Admitting: Anesthesiology

## 2014-12-23 ENCOUNTER — Encounter: Payer: Self-pay | Admitting: Anesthesiology

## 2014-12-23 VITALS — BP 129/66 | HR 98 | Temp 98.4°F | Resp 16 | Ht 65.0 in | Wt 190.0 lb

## 2014-12-23 DIAGNOSIS — M5416 Radiculopathy, lumbar region: Secondary | ICD-10-CM | POA: Insufficient documentation

## 2014-12-23 DIAGNOSIS — M5136 Other intervertebral disc degeneration, lumbar region: Secondary | ICD-10-CM

## 2014-12-23 DIAGNOSIS — M1288 Other specific arthropathies, not elsewhere classified, other specified site: Secondary | ICD-10-CM | POA: Insufficient documentation

## 2014-12-23 DIAGNOSIS — M545 Low back pain, unspecified: Secondary | ICD-10-CM

## 2014-12-23 DIAGNOSIS — G8929 Other chronic pain: Secondary | ICD-10-CM | POA: Insufficient documentation

## 2014-12-23 DIAGNOSIS — M961 Postlaminectomy syndrome, not elsewhere classified: Secondary | ICD-10-CM

## 2014-12-23 DIAGNOSIS — M533 Sacrococcygeal disorders, not elsewhere classified: Secondary | ICD-10-CM

## 2014-12-23 DIAGNOSIS — M47816 Spondylosis without myelopathy or radiculopathy, lumbar region: Secondary | ICD-10-CM

## 2014-12-23 MED ORDER — OXYCODONE HCL 10 MG PO TABS: 10.0000 mg | ORAL_TABLET | Freq: Every evening | ORAL | Status: DC | PRN

## 2014-12-23 MED ORDER — HYDROCODONE-ACETAMINOPHEN 10-325 MG PO TABS: 1.0000 | ORAL_TABLET | Freq: Three times a day (TID) | ORAL | Status: DC

## 2014-12-23 NOTE — Progress Notes (Signed)
Safety precautions to be maintained throughout the outpatient stay will include: orient to surroundings, keep bed in low position, maintain call bell within reach at all times, provide assistance with transfer out of bed and ambulation.  

## 2014-12-24 NOTE — Progress Notes (Signed)
   Subjective:    Patient ID: Diana Hale, female    DOB: Jun 27, 1931, 81 y.o.   MRN: [id number]  HPI  This patient return to the clinic today indicating that she did not get any relief from the  Caudal epidural steroid injection which I performed for her. In fact she said she got an adverse re the steroids or the local anesthetic or even possibly the Versed and fentanyl which are used for the sedation. I told her that these symptoms were very bizarre and unusual but nevertheless because of her expe any further injections for  Her Her behavior while stoic is or can be associated with drug seeking behavior,pain dramatization and possibly pain exaggeration.whereas icon tenure to think that she is credible her behavior helps to soda cc of some pain behavior I would nevertheless continued to gi but I would be vigilant looking for other symptomatology Her subjective pain intensity rating today is 85%  Review of Systems  Constitutional: Negative.   [name]: Negative.   Eyes: Negative.   Respiratory: Negative.   Cardiovascular: Negative.   Gastrointestinal: Negative.   Endocrine: Negative.   Genitourinary: Negative.   Musculoskeletal: Negative.   Skin: Negative.   Neurological: Negative.   Hematological: Negative.   Psychiatric/Behavioral: Negative.        Objective:   Physical Exam  Cardiovascular:  This patient's appears alert and well oriented in time and space She had a dour expression and this was mixed with some stoicism Her blood pressure is 129 66 mmHg Her pulse is 98 bpm Equal and regular Heart sounds 1 and 2 were heard in a The were no audible murmurs picher was 98.67F Respirations 16 breaths per minute His PO2 was 99 Chest is clinically clear There are no adventitious sounds Abdomen is soft and nontender There is no palpable organomegaly Pupils are equal and reactive Cranial nerves are intact There are no new neurological or musculoskeletal findings           Assessment & Plan:   Assessment 1 chronic low back pain 2 lumbar degenerative disc disease 3 lumbar radiculopathy 4 lumbar facet arthropathy   Plan of management We'll plan to discontinue all interventions for her I will plan to continue all her on t for the next few weeks and refer her back to her  Primary care physician for continued management since I would be leaving the country fo We will plan to give her today Percocet 10/325 one tablet to be taken at night and I will give her 21 pills Percocet 10/325 and I will give her 1 tablet every 8 hours and plan to give her 66 t We'll plan to see her in 3 weeks   Establish ed patient    Level 2   Lance Bosch M.D.

## 2015-01-15 ENCOUNTER — Telehealth: Payer: Self-pay | Admitting: Anesthesiology

## 2015-01-15 MED ORDER — HYDROCODONE-ACETAMINOPHEN 10-325 MG PO TABS: 1.0000 | ORAL_TABLET | Freq: Three times a day (TID) | ORAL | Status: DC

## 2015-01-15 NOTE — Telephone Encounter (Signed)
Will be out of meds before next appt / can she come in and pick up script ?

## 2015-01-15 NOTE — Addendum Note (Signed)
Addended by: Lance Bosch on: 01/15/2015 02:34 PM   Modules accepted: Orders

## 2015-01-15 NOTE — Progress Notes (Signed)
   Subjective:    Patient ID: Diana Hale, female    DOB: [birth year], 81 y.o.   MRN: [id number]  HPI    Review of Systems     Objective:   Physical Exam        Assessment & Plan:   I saw this patient on November 9 and give her a three-week supply of medication and asked her to see her primary care physician since she did not require any further interventions and also since I was leaving the country She has not done so some given her an additional 2 weeks supply of hydrocodone with acetaminophen 10/325 one tablet every 8 age on a when necessary basis I'll give her 85 tablets and asked her to see her primary care physician in follow-up  [place] M.D.

## 2015-01-25 ENCOUNTER — Ambulatory Visit: Payer: Medicare Other | Admitting: Anesthesiology

## 2015-02-11 ENCOUNTER — Other Ambulatory Visit: Payer: Self-pay | Admitting: Internal Medicine

## 2015-02-11 DIAGNOSIS — M4316 Spondylolisthesis, lumbar region: Secondary | ICD-10-CM

## 2015-03-03 ENCOUNTER — Ambulatory Visit: Admission: RE | Admit: 2015-03-03 | Payer: Medicare Other | Source: Ambulatory Visit

## 2015-03-03 ENCOUNTER — Ambulatory Visit
Admission: RE | Admit: 2015-03-03 | Discharge: 2015-03-03 | Disposition: A | Discharge status: Home / Self Care | Payer: Medicare Other | Source: Ambulatory Visit | Attending: Internal Medicine | Admitting: Internal Medicine

## 2015-03-03 DIAGNOSIS — M5136 Other intervertebral disc degeneration, lumbar region: Secondary | ICD-10-CM | POA: Insufficient documentation

## 2015-03-03 DIAGNOSIS — M545 Low back pain: Secondary | ICD-10-CM | POA: Diagnosis not present

## 2015-03-03 DIAGNOSIS — M4316 Spondylolisthesis, lumbar region: Secondary | ICD-10-CM | POA: Insufficient documentation

## 2015-03-03 DIAGNOSIS — M4806 Spinal stenosis, lumbar region: Secondary | ICD-10-CM | POA: Insufficient documentation

## 2015-04-01 ENCOUNTER — Other Ambulatory Visit: Payer: Self-pay | Admitting: Neurosurgery

## 2015-04-07 ENCOUNTER — Encounter (HOSPITAL_COMMUNITY)
Admission: RE | Admit: 2015-04-07 | Discharge: 2015-04-07 | Disposition: A | Discharge status: Home / Self Care | Payer: Medicare Other | Source: Ambulatory Visit | Attending: Neurosurgery | Admitting: Neurosurgery

## 2015-04-07 ENCOUNTER — Encounter (HOSPITAL_COMMUNITY): Payer: Self-pay

## 2015-04-07 DIAGNOSIS — Z96641 Presence of right artificial hip joint: Secondary | ICD-10-CM | POA: Diagnosis not present

## 2015-04-07 DIAGNOSIS — E039 Hypothyroidism, unspecified: Secondary | ICD-10-CM | POA: Insufficient documentation

## 2015-04-07 DIAGNOSIS — Z01818 Encounter for other preprocedural examination: Secondary | ICD-10-CM | POA: Diagnosis present

## 2015-04-07 DIAGNOSIS — Z981 Arthrodesis status: Secondary | ICD-10-CM | POA: Diagnosis not present

## 2015-04-07 DIAGNOSIS — Z87891 Personal history of nicotine dependence: Secondary | ICD-10-CM | POA: Diagnosis not present

## 2015-04-07 DIAGNOSIS — Z0183 Encounter for blood typing: Secondary | ICD-10-CM | POA: Diagnosis not present

## 2015-04-07 DIAGNOSIS — K219 Gastro-esophageal reflux disease without esophagitis: Secondary | ICD-10-CM | POA: Diagnosis not present

## 2015-04-07 DIAGNOSIS — Z794 Long term (current) use of insulin: Secondary | ICD-10-CM | POA: Insufficient documentation

## 2015-04-07 DIAGNOSIS — Z8543 Personal history of malignant neoplasm of ovary: Secondary | ICD-10-CM | POA: Diagnosis not present

## 2015-04-07 DIAGNOSIS — Z01812 Encounter for preprocedural laboratory examination: Secondary | ICD-10-CM | POA: Insufficient documentation

## 2015-04-07 DIAGNOSIS — Z9221 Personal history of antineoplastic chemotherapy: Secondary | ICD-10-CM | POA: Diagnosis not present

## 2015-04-07 DIAGNOSIS — Z79899 Other long term (current) drug therapy: Secondary | ICD-10-CM | POA: Insufficient documentation

## 2015-04-07 DIAGNOSIS — E119 Type 2 diabetes mellitus without complications: Secondary | ICD-10-CM | POA: Diagnosis not present

## 2015-04-07 LAB — TYPE AND SCREEN
ABO/RH(D): A POS
Antibody Screen: NEGATIVE

## 2015-04-07 LAB — SURGICAL PCR SCREEN
MRSA, PCR: NEGATIVE
Staphylococcus aureus: NEGATIVE

## 2015-04-07 LAB — BASIC METABOLIC PANEL
ANION GAP: 10 (ref 5–15)
BUN: 12 mg/dL (ref 6–20)
CHLORIDE: 106 mmol/L (ref 101–111)
CO2: 26 mmol/L (ref 22–32)
Calcium: 9.2 mg/dL (ref 8.9–10.3)
Creatinine, Ser: 1.13 mg/dL — ABNORMAL HIGH (ref 0.44–1.00)
GFR calc Af Amer: 51 mL/min — ABNORMAL LOW (ref >60)
GFR, EST NON AFRICAN AMERICAN: 44 mL/min — AB (ref >60)
GLUCOSE: 319 mg/dL — AB (ref 65–99)
POTASSIUM: 4.8 mmol/L (ref 3.5–5.1)
Sodium: 142 mmol/L (ref 135–145)

## 2015-04-07 LAB — CBC
HEMATOCRIT: 36.2 % (ref 36.0–46.0)
HEMOGLOBIN: 10.9 g/dL — AB (ref 12.0–15.0)
MCH: 25.2 pg — AB (ref 26.0–34.0)
MCHC: 30.1 g/dL (ref 30.0–36.0)
MCV: 83.6 fL (ref 78.0–100.0)
PLATELETS: 302 10*3/uL (ref 150–400)
RBC: 4.33 MIL/uL (ref 3.87–5.11)
RDW: 15 % (ref 11.5–15.5)
WBC: 8.1 10*3/uL (ref 4.0–10.5)

## 2015-04-07 LAB — GLUCOSE, CAPILLARY: GLUCOSE-CAPILLARY: 234 mg/dL — AB (ref 65–99)

## 2015-04-07 NOTE — Pre-Procedure Instructions (Signed)
Diana Hale  04/07/2015      [id number] [place], [facility] - [place] [address] Phone: [phone] Fax: [phone]    Your procedure is scheduled on  Monday  04/12/15  Report to [facility] Admitting at 530 A.M.  Call this number if you have problems the morning of surgery:  [phone]   Remember:  Do not eat food or drink liquids after midnight.  Take these medicines the morning of surgery with A SIP OF WATER   LEVOTHYROXINE (SYNTHROID), PANTOPRAZOLE (PROTONIX)       (NO VITAMINS HERBAL MEDICINES, IBUPROFEN/ ADVIL/ MOTRIN, ASPIRIN, GOODY POWDERS/ BC'S) How to Manage Your Diabetes Before Surgery   Why is it important to control my blood sugar before and after surgery?   Improving blood sugar levels before and after surgery helps healing and can limit problems.  A way of improving blood sugar control is eating a healthy diet by:  - Eating less sugar and carbohydrates  - Increasing activity/exercise  - Talk with your doctor about reaching your blood sugar goals  High blood sugars (greater than 180 mg/dL) can raise your risk of infections and slow down your recovery so you will need to focus on controlling your diabetes during the weeks before surgery.  Make sure that the doctor who takes care of your diabetes knows about your planned surgery including the date and location.  How do I manage my blood sugars before surgery?   Check your blood sugar at least 4 times a day, 2 days before surgery to make sure that they are not too high or low.   Check your blood sugar the morning of your surgery when you wake up and every 2               hours until you get to the Short-Stay unit.  If your blood sugar is less than 70 mg/dL, you will need to treat for low blood sugar by:  Treat a low blood sugar (less than 70 mg/dL) with 1/2 cup of clear juice (cranberry or apple), 4 glucose tablets, OR glucose gel.  Recheck  blood sugar in 15 minutes after treatment (to make sure it is greater than 70 mg/dL).  If blood sugar is not greater than 70 mg/dL on re-check, call [phone] for further instructions.   Report your blood sugar to the Short-Stay nurse when you get to Short-Stay.  References:  [facility], 2007 "How to Manage your Diabetes Before and After Surgery".  What do I do about my diabetes medications?   Do not take oral diabetes medicines (pills) the morning of surgery.  THE NIGHT BEFORE SURGERY, take 42 units of  NOVOLOG 70/30 Insulin.    THE MORNING OF SURGERY, take  NO  Insulin.    Do not take other diabetes injectables the day of surgery including Byetta, Victoza, Bydureon, and Trulicity.    If your CBG is greater than 220 mg/dL, you may take 1/2 of your sliding scale (correction) dose of insulin.   For patients with "Insulin Pumps":  Contact your diabetes doctor for specific instructions before surgery.   Decrease basal insulin rates by 20% at midnight the night before surgery.  Note that if your surgery is planned to be longer than 2 hours, your insulin pump will be removed and intravenous (IV) insulin will be started and managed by the nurses and anesthesiologist.  You will be able to restart  your insulin pump once you are awake and able to manage it.  Make sure to bring insulin pump supplies to the hospital with you in case your site needs to be changed.        Do not wear jewelry, make-up or nail polish.  Do not wear lotions, powders, or perfumes.  You may wear deodorant.  Do not shave 48 hours prior to surgery.  Men may shave face and neck.  Do not bring valuables to the hospital.  [facility] is not responsible for any belongings or valuables.  Contacts, dentures or bridgework may not be worn into surgery.  Leave your suitcase in the car.  After surgery it may be brought to your room.  For patients admitted to the hospital, discharge  time will be determined by your treatment team.  Patients discharged the day of surgery will not be allowed to drive home.   Name and phone number of your driver:    Special instructions:  SEE PREPARING FOR SURGERY  Please read over the following fact sheets that you were given. Pain Booklet, Coughing and Deep Breathing, Blood Transfusion Information, MRSA Information and Surgical Site Infection Prevention

## 2015-04-08 LAB — HEMOGLOBIN A1C
HEMOGLOBIN A1C: 7.8 % — AB (ref 4.8–5.6)
MEAN PLASMA GLUCOSE: 177 mg/dL

## 2015-04-08 NOTE — Progress Notes (Signed)
Anesthesia chart review: Patient is an 82 year old female scheduled for L2-3 PLIF, removal L3-5 6.35 Legacy on 04/12/15 by Dr. Saintclair Halsted.  History includes hypothyroidism, DM2, former smoker, GERD, diverticulosis, ovarian cancer s/p surgery and chemotherapy, murmur (asymptomatic, never told she had a valve issue), cholecystectomy, tonsillectomy, right THA, anterior approach 09/03/14, L3-4 PLIF '13, L4-5 '08, C4-6 ACDF '05. BMI is consistent with obesity. PCP is Dr. Ramonita Lab (last visit 03/10/15) and endocrinologist is Dr. Lucilla Lame (last visit 02/17/15), both with [facility] (see Care Everywhere).  Meds include folic acid, azelastine nasal spray, Novolog 70/30, levothyroxine, Protonix.   08/26/14 EKG: SR with PACs, right BBB, LAD/LAFB, LVH with QRS widening. When compared to 04/16/12 tracing, PACs are new. Has had right BBB, LAFB, bifascicular block dating back to at least 02/10/09. She denied chest pain, SOB.   According to PCP and endocrinology notes in Care Everywhere:  - "January 2005.Echocardiogram and Myoview ETT with normal LV function and no evidence of ischemia,Jan 2005.Myoview negative,2/10"  - "Cardiology and pulmonary consults 2010.Stress test negative.Sleep study nondiagnostic,but minimal actual sleep time,2010"  Preoperative labs noted. H/H 10.9/36.2, stable since 08/2014. T&S done. Cr 1.13. Non-fasting glucose 319, but A1c 7.8 which does not reflect consistent glucose levels in this range. Her A1c is also down slightly from 02/11/15 result of 7.9 (Care Everywhere). At her last endocrinology visit, CBG readings were running 93-262 with A1c 7.95. 70/30 insulin adjusted to 60 Units BID (up from 55 Units). I called and spoke with patient. She had just eaten before her PAT visit. She is compliant with her insulin. Home fasting CBG readings run ~ 90-130 and supper time readings run ~ 130-160.    Her EKG appears stable. She denied CV symptoms. She tolerated THA approximately six months ago. Fasting glucose  readings run < 200. If no acute changes then I anticipate that she can proceed as planned.  George Hugh [facility] Short Stay Center/Anesthesiology Phone [phone] 04/08/2015 11:15 AM

## 2015-04-11 MED ORDER — CEFAZOLIN SODIUM-DEXTROSE 2-3 GM-% IV SOLR
2.0000 g | INTRAVENOUS | Status: AC
  Administered 2015-04-12: 2 g via INTRAVENOUS
  Filled 2015-04-11: qty 50

## 2015-04-11 MED ORDER — DEXAMETHASONE SODIUM PHOSPHATE 10 MG/ML IJ SOLN
10.0000 mg | INTRAMUSCULAR | Status: AC
  Administered 2015-04-12: 10 mg via INTRAVENOUS
  Filled 2015-04-11: qty 1

## 2015-04-12 ENCOUNTER — Inpatient Hospital Stay (HOSPITAL_COMMUNITY): Payer: Medicare Other | Admitting: Anesthesiology

## 2015-04-12 ENCOUNTER — Encounter (HOSPITAL_COMMUNITY): Payer: Self-pay | Admitting: *Deleted

## 2015-04-12 ENCOUNTER — Encounter (HOSPITAL_COMMUNITY)
Admission: RE | Disposition: A | Discharge status: Home / Self Care | Payer: Self-pay | Source: Ambulatory Visit | Attending: Neurosurgery

## 2015-04-12 ENCOUNTER — Inpatient Hospital Stay (HOSPITAL_COMMUNITY): Payer: Medicare Other | Admitting: Vascular Surgery

## 2015-04-12 ENCOUNTER — Inpatient Hospital Stay (HOSPITAL_COMMUNITY)
Admission: RE | Admit: 2015-04-12 | Discharge: 2015-04-17 | DRG: 460 | Disposition: A | Discharge status: Home / Self Care | Payer: Medicare Other | Source: Ambulatory Visit | Attending: Neurosurgery | Admitting: Neurosurgery

## 2015-04-12 ENCOUNTER — Inpatient Hospital Stay (HOSPITAL_COMMUNITY): Payer: Medicare Other

## 2015-04-12 DIAGNOSIS — M5126 Other intervertebral disc displacement, lumbar region: Secondary | ICD-10-CM | POA: Diagnosis present

## 2015-04-12 DIAGNOSIS — M48061 Spinal stenosis, lumbar region without neurogenic claudication: Secondary | ICD-10-CM | POA: Diagnosis present

## 2015-04-12 DIAGNOSIS — Z96642 Presence of left artificial hip joint: Secondary | ICD-10-CM | POA: Diagnosis present

## 2015-04-12 DIAGNOSIS — Z87891 Personal history of nicotine dependence: Secondary | ICD-10-CM | POA: Diagnosis not present

## 2015-04-12 DIAGNOSIS — M532X6 Spinal instabilities, lumbar region: Secondary | ICD-10-CM | POA: Diagnosis present

## 2015-04-12 DIAGNOSIS — Z794 Long term (current) use of insulin: Secondary | ICD-10-CM | POA: Diagnosis not present

## 2015-04-12 DIAGNOSIS — M4806 Spinal stenosis, lumbar region: Secondary | ICD-10-CM | POA: Diagnosis present

## 2015-04-12 DIAGNOSIS — E119 Type 2 diabetes mellitus without complications: Secondary | ICD-10-CM | POA: Diagnosis present

## 2015-04-12 DIAGNOSIS — E039 Hypothyroidism, unspecified: Secondary | ICD-10-CM | POA: Diagnosis present

## 2015-04-12 DIAGNOSIS — Z8543 Personal history of malignant neoplasm of ovary: Secondary | ICD-10-CM

## 2015-04-12 DIAGNOSIS — Z419 Encounter for procedure for purposes other than remedying health state, unspecified: Secondary | ICD-10-CM

## 2015-04-12 DIAGNOSIS — M549 Dorsalgia, unspecified: Secondary | ICD-10-CM | POA: Diagnosis present

## 2015-04-12 DIAGNOSIS — K219 Gastro-esophageal reflux disease without esophagitis: Secondary | ICD-10-CM | POA: Diagnosis present

## 2015-04-12 LAB — GLUCOSE, CAPILLARY
GLUCOSE-CAPILLARY: 215 mg/dL — AB (ref 65–99)
GLUCOSE-CAPILLARY: 304 mg/dL — AB (ref 65–99)
GLUCOSE-CAPILLARY: 215 mg/dL — AB (ref 65–99)
Glucose-Capillary: 128 mg/dL — ABNORMAL HIGH (ref 65–99)

## 2015-04-12 SURGERY — POSTERIOR LUMBAR FUSION 1 WITH HARDWARE REMOVAL
Anesthesia: General | Site: Back

## 2015-04-12 MED ORDER — PHENOL 1.4 % MT LIQD: 1.0000 | OROMUCOSAL | Status: DC | PRN

## 2015-04-12 MED ORDER — CYCLOBENZAPRINE HCL 10 MG PO TABS
10.0000 mg | ORAL_TABLET | Freq: Two times a day (BID) | ORAL | Status: DC | PRN
  Administered 2015-04-13 (×2): 10 mg via ORAL
  Filled 2015-04-12 (×2): qty 1

## 2015-04-12 MED ORDER — MENTHOL 3 MG MT LOZG: 1.0000 | LOZENGE | OROMUCOSAL | Status: DC | PRN

## 2015-04-12 MED ORDER — ONDANSETRON HCL 4 MG/2ML IJ SOLN: 4.0000 mg | INTRAMUSCULAR | Status: DC | PRN

## 2015-04-12 MED ORDER — LIDOCAINE HCL (CARDIAC) 20 MG/ML IV SOLN
INTRAVENOUS | Status: AC
  Filled 2015-04-12: qty 5

## 2015-04-12 MED ORDER — LIDOCAINE HCL (CARDIAC) 20 MG/ML IV SOLN
INTRAVENOUS | Status: DC | PRN
  Administered 2015-04-12: 50 mg via INTRAVENOUS

## 2015-04-12 MED ORDER — ROCURONIUM BROMIDE 100 MG/10ML IV SOLN
INTRAVENOUS | Status: DC | PRN
  Administered 2015-04-12: 50 mg via INTRAVENOUS
  Administered 2015-04-12: 30 mg via INTRAVENOUS
  Administered 2015-04-12: 20 mg via INTRAVENOUS

## 2015-04-12 MED ORDER — ROCURONIUM BROMIDE 50 MG/5ML IV SOLN
INTRAVENOUS | Status: AC
  Filled 2015-04-12: qty 1

## 2015-04-12 MED ORDER — VANCOMYCIN HCL 1000 MG IV SOLR
INTRAVENOUS | Status: DC | PRN
  Administered 2015-04-12: 1000 mg via TOPICAL

## 2015-04-12 MED ORDER — INSULIN ASPART PROT & ASPART (70-30 MIX) 100 UNIT/ML ~~LOC~~ SUSP
60.0000 [IU] | Freq: Two times a day (BID) | SUBCUTANEOUS | Status: DC
  Administered 2015-04-12 – 2015-04-17 (×8): 60 [IU] via SUBCUTANEOUS
  Filled 2015-04-12 (×2): qty 10

## 2015-04-12 MED ORDER — DIPHENHYDRAMINE HCL 50 MG/ML IJ SOLN: 12.5000 mg | Freq: Four times a day (QID) | INTRAMUSCULAR | Status: DC | PRN

## 2015-04-12 MED ORDER — LEVOTHYROXINE SODIUM 100 MCG PO TABS
100.0000 ug | ORAL_TABLET | Freq: Every day | ORAL | Status: DC
  Administered 2015-04-13 – 2015-04-17 (×5): 100 ug via ORAL
  Filled 2015-04-12 (×5): qty 1

## 2015-04-12 MED ORDER — VITAMIN B-12 1000 MCG PO TABS
2500.0000 ug | ORAL_TABLET | Freq: Every day | ORAL | Status: DC
  Administered 2015-04-13 – 2015-04-17 (×5): 2500 ug via ORAL
  Filled 2015-04-12 (×2): qty 3
  Filled 2015-04-12: qty 2.5
  Filled 2015-04-12: qty 3
  Filled 2015-04-12: qty 2.5

## 2015-04-12 MED ORDER — CEFAZOLIN SODIUM-DEXTROSE 2-3 GM-% IV SOLR
2.0000 g | Freq: Three times a day (TID) | INTRAVENOUS | Status: AC
  Administered 2015-04-12 – 2015-04-14 (×6): 2 g via INTRAVENOUS
  Filled 2015-04-12 (×6): qty 50

## 2015-04-12 MED ORDER — AZELASTINE HCL 0.1 % NA SOLN
1.0000 | Freq: Two times a day (BID) | NASAL | Status: DC
  Administered 2015-04-12 – 2015-04-17 (×10): 1 via NASAL
  Filled 2015-04-12 (×2): qty 30

## 2015-04-12 MED ORDER — PROMETHAZINE HCL 25 MG/ML IJ SOLN: 6.2500 mg | INTRAMUSCULAR | Status: DC | PRN

## 2015-04-12 MED ORDER — LIDOCAINE-EPINEPHRINE 1 %-1:100000 IJ SOLN
INTRAMUSCULAR | Status: DC | PRN
  Administered 2015-04-12: 10 mL

## 2015-04-12 MED ORDER — ACETAMINOPHEN 325 MG PO TABS
650.0000 mg | ORAL_TABLET | ORAL | Status: DC | PRN
  Administered 2015-04-13 – 2015-04-17 (×3): 650 mg via ORAL
  Filled 2015-04-12 (×3): qty 2

## 2015-04-12 MED ORDER — METOPROLOL TARTRATE 1 MG/ML IV SOLN
INTRAVENOUS | Status: DC | PRN
  Administered 2015-04-12 (×2): 1 mg via INTRAVENOUS

## 2015-04-12 MED ORDER — ONDANSETRON HCL 4 MG/2ML IJ SOLN
INTRAMUSCULAR | Status: AC
  Filled 2015-04-12: qty 2

## 2015-04-12 MED ORDER — DIPHENHYDRAMINE HCL 12.5 MG/5ML PO ELIX: 12.5000 mg | ORAL_SOLUTION | Freq: Four times a day (QID) | ORAL | Status: DC | PRN

## 2015-04-12 MED ORDER — ONDANSETRON HCL 4 MG/2ML IJ SOLN: 4.0000 mg | Freq: Four times a day (QID) | INTRAMUSCULAR | Status: DC | PRN

## 2015-04-12 MED ORDER — FOLIC ACID 1 MG PO TABS
1.0000 mg | ORAL_TABLET | Freq: Every day | ORAL | Status: DC
Start: 2015-04-12 — End: 2015-04-17
  Administered 2015-04-13 – 2015-04-17 (×5): 1 mg via ORAL
  Filled 2015-04-12 (×5): qty 1

## 2015-04-12 MED ORDER — ONDANSETRON HCL 4 MG/2ML IJ SOLN
INTRAMUSCULAR | Status: DC | PRN
  Administered 2015-04-12: 4 mg via INTRAVENOUS

## 2015-04-12 MED ORDER — PHENYLEPHRINE 40 MCG/ML (10ML) SYRINGE FOR IV PUSH (FOR BLOOD PRESSURE SUPPORT)
PREFILLED_SYRINGE | INTRAVENOUS | Status: AC
  Filled 2015-04-12: qty 10

## 2015-04-12 MED ORDER — FENTANYL CITRATE (PF) 250 MCG/5ML IJ SOLN
INTRAMUSCULAR | Status: AC
  Filled 2015-04-12: qty 5

## 2015-04-12 MED ORDER — SODIUM CHLORIDE 0.9% FLUSH: 9.0000 mL | INTRAVENOUS | Status: DC | PRN

## 2015-04-12 MED ORDER — SUGAMMADEX SODIUM 200 MG/2ML IV SOLN
INTRAVENOUS | Status: DC | PRN
  Administered 2015-04-12: 200 mg via INTRAVENOUS

## 2015-04-12 MED ORDER — SODIUM CHLORIDE 0.9% FLUSH: 3.0000 mL | INTRAVENOUS | Status: DC | PRN

## 2015-04-12 MED ORDER — THROMBIN 20000 UNITS EX SOLR
CUTANEOUS | Status: DC | PRN
  Administered 2015-04-12: 09:00:00 via TOPICAL

## 2015-04-12 MED ORDER — SODIUM CHLORIDE 0.9% FLUSH
3.0000 mL | Freq: Two times a day (BID) | INTRAVENOUS | Status: DC
  Administered 2015-04-12 – 2015-04-16 (×9): 3 mL via INTRAVENOUS

## 2015-04-12 MED ORDER — BUPIVACAINE LIPOSOME 1.3 % IJ SUSP
20.0000 mL | INTRAMUSCULAR | Status: AC
  Filled 2015-04-12: qty 20

## 2015-04-12 MED ORDER — FENTANYL CITRATE (PF) 100 MCG/2ML IJ SOLN: 25.0000 ug | INTRAMUSCULAR | Status: DC | PRN

## 2015-04-12 MED ORDER — VITAMIN D 1000 UNITS PO TABS
2000.0000 [IU] | ORAL_TABLET | Freq: Every day | ORAL | Status: DC
  Administered 2015-04-13 – 2015-04-17 (×5): 2000 [IU] via ORAL
  Filled 2015-04-12 (×5): qty 2

## 2015-04-12 MED ORDER — NALOXONE HCL 0.4 MG/ML IJ SOLN: 0.4000 mg | INTRAMUSCULAR | Status: DC | PRN

## 2015-04-12 MED ORDER — BUPIVACAINE LIPOSOME 1.3 % IJ SUSP
INTRAMUSCULAR | Status: DC | PRN
  Administered 2015-04-12: 20 mL

## 2015-04-12 MED ORDER — PROPOFOL 10 MG/ML IV BOLUS
INTRAVENOUS | Status: DC | PRN
Start: 2015-04-12 — End: 2015-04-12
  Administered 2015-04-12: 120 mg via INTRAVENOUS

## 2015-04-12 MED ORDER — VANCOMYCIN HCL 1000 MG IV SOLR
INTRAVENOUS | Status: AC
  Filled 2015-04-12: qty 1000

## 2015-04-12 MED ORDER — HYDROMORPHONE HCL 1 MG/ML IJ SOLN
INTRAMUSCULAR | Status: AC
  Filled 2015-04-12: qty 1

## 2015-04-12 MED ORDER — SODIUM CHLORIDE 0.9 % IR SOLN
Status: DC | PRN
  Administered 2015-04-12: 09:00:00

## 2015-04-12 MED ORDER — HYDROMORPHONE 1 MG/ML IV SOLN
INTRAVENOUS | Status: AC
Start: 2015-04-12 — End: 2015-04-12
  Filled 2015-04-12: qty 25

## 2015-04-12 MED ORDER — SUGAMMADEX SODIUM 200 MG/2ML IV SOLN
INTRAVENOUS | Status: AC
  Filled 2015-04-12: qty 2

## 2015-04-12 MED ORDER — 0.9 % SODIUM CHLORIDE (POUR BTL) OPTIME
TOPICAL | Status: DC | PRN
  Administered 2015-04-12: 1000 mL

## 2015-04-12 MED ORDER — PANTOPRAZOLE SODIUM 40 MG PO TBEC
40.0000 mg | DELAYED_RELEASE_TABLET | Freq: Two times a day (BID) | ORAL | Status: DC
  Administered 2015-04-12 – 2015-04-17 (×10): 40 mg via ORAL
  Filled 2015-04-12 (×10): qty 1

## 2015-04-12 MED ORDER — METOPROLOL TARTRATE 1 MG/ML IV SOLN
INTRAVENOUS | Status: AC
  Filled 2015-04-12: qty 5

## 2015-04-12 MED ORDER — SODIUM CHLORIDE 0.9 % IV SOLN: 250.0000 mL | INTRAVENOUS | Status: DC

## 2015-04-12 MED ORDER — LACTATED RINGERS IV SOLN
INTRAVENOUS | Status: DC | PRN
  Administered 2015-04-12 (×2): via INTRAVENOUS

## 2015-04-12 MED ORDER — ACETAMINOPHEN 650 MG RE SUPP: 650.0000 mg | RECTAL | Status: DC | PRN

## 2015-04-12 MED ORDER — HYDROMORPHONE 1 MG/ML IV SOLN
INTRAVENOUS | Status: DC
  Administered 2015-04-12: 12:00:00 via INTRAVENOUS

## 2015-04-12 MED ORDER — SUCCINYLCHOLINE CHLORIDE 20 MG/ML IJ SOLN
INTRAMUSCULAR | Status: AC
  Filled 2015-04-12: qty 1

## 2015-04-12 MED ORDER — PROPOFOL 10 MG/ML IV BOLUS
INTRAVENOUS | Status: AC
  Filled 2015-04-12: qty 20

## 2015-04-12 MED ORDER — FENTANYL CITRATE (PF) 100 MCG/2ML IJ SOLN
INTRAMUSCULAR | Status: DC | PRN
  Administered 2015-04-12: 100 ug via INTRAVENOUS
  Administered 2015-04-12 (×8): 50 ug via INTRAVENOUS

## 2015-04-12 MED ORDER — BIOTIN 5000 MCG PO TABS: 1.0000 | ORAL_TABLET | Freq: Every day | ORAL | Status: DC

## 2015-04-12 MED ORDER — PHENYLEPHRINE HCL 10 MG/ML IJ SOLN
10.0000 mg | INTRAVENOUS | Status: DC | PRN
  Administered 2015-04-12: 10 ug/min via INTRAVENOUS

## 2015-04-12 MED FILL — Sodium Chloride IV Soln 0.9%: INTRAVENOUS | Qty: 1000 | Status: AC

## 2015-04-12 MED FILL — Heparin Sodium (Porcine) Inj 1000 Unit/ML: INTRAMUSCULAR | Qty: 30 | Status: AC

## 2015-04-12 SURGICAL SUPPLY — 73 items
APL SKNCLS STERI-STRIP NONHPOA (GAUZE/BANDAGES/DRESSINGS) ×1
BAG DECANTER FOR FLEXI CONT (MISCELLANEOUS) ×3 IMPLANT
BENZOIN TINCTURE PRP APPL 2/3 (GAUZE/BANDAGES/DRESSINGS) ×3 IMPLANT
BLADE CLIPPER SURG (BLADE) IMPLANT
BLADE SURG 11 STRL SS (BLADE) ×3 IMPLANT
BONE ALLOSTEM MORSELIZED 5CC (Bone Implant) ×2 IMPLANT
BRUSH SCRUB EZ PLAIN DRY (MISCELLANEOUS) ×3 IMPLANT
BUR MATCHSTICK NEURO 3.0 LAGG (BURR) ×3 IMPLANT
BUR PRECISION FLUTE 6.0 (BURR) ×3 IMPLANT
CAGE RISE 11-17-15 10X22 (Cage) ×4 IMPLANT
CANISTER SUCT 3000ML PPV (MISCELLANEOUS) ×3 IMPLANT
CAP REVERE LOCKING (Cap) ×4 IMPLANT
CLOSURE WOUND 1/2 X4 (GAUZE/BANDAGES/DRESSINGS) ×1
CONT SPEC 4OZ CLIKSEAL STRL BL (MISCELLANEOUS) ×5 IMPLANT
COVER BACK TABLE 24X17X13 BIG (DRAPES) IMPLANT
COVER BACK TABLE 60X90IN (DRAPES) ×3 IMPLANT
DECANTER SPIKE VIAL GLASS SM (MISCELLANEOUS) ×3 IMPLANT
DRAPE C-ARM 42X72 X-RAY (DRAPES) ×3 IMPLANT
DRAPE C-ARMOR (DRAPES) ×3 IMPLANT
DRAPE LAPAROTOMY 100X72X124 (DRAPES) ×3 IMPLANT
DRAPE POUCH INSTRU U-SHP 10X18 (DRAPES) ×3 IMPLANT
DRAPE PROXIMA HALF (DRAPES) IMPLANT
DRAPE SURG 17X23 STRL (DRAPES) ×3 IMPLANT
DRSG OPSITE 4X5.5 SM (GAUZE/BANDAGES/DRESSINGS) ×2 IMPLANT
DRSG OPSITE POSTOP 4X8 (GAUZE/BANDAGES/DRESSINGS) ×2 IMPLANT
DURAPREP 26ML APPLICATOR (WOUND CARE) ×3 IMPLANT
ELECT BLADE 4.0 EZ CLEAN MEGAD (MISCELLANEOUS) ×3
ELECT REM PT RETURN 9FT ADLT (ELECTROSURGICAL) ×3
ELECTRODE BLDE 4.0 EZ CLN MEGD (MISCELLANEOUS) IMPLANT
ELECTRODE REM PT RTRN 9FT ADLT (ELECTROSURGICAL) ×1 IMPLANT
EVACUATOR 3/16  PVC DRAIN (DRAIN) ×2
EVACUATOR 3/16 PVC DRAIN (DRAIN) ×1 IMPLANT
GAUZE SPONGE 4X4 12PLY STRL (GAUZE/BANDAGES/DRESSINGS) ×3 IMPLANT
GAUZE SPONGE 4X4 16PLY XRAY LF (GAUZE/BANDAGES/DRESSINGS) ×2 IMPLANT
GLOVE BIO SURGEON STRL SZ8 (GLOVE) ×6 IMPLANT
GLOVE ECLIPSE 7.5 STRL STRAW (GLOVE) IMPLANT
GLOVE EXAM NITRILE LRG STRL (GLOVE) IMPLANT
GLOVE EXAM NITRILE MD LF STRL (GLOVE) IMPLANT
GLOVE EXAM NITRILE XL STR (GLOVE) IMPLANT
GLOVE EXAM NITRILE XS STR PU (GLOVE) IMPLANT
GLOVE INDICATOR 8.5 STRL (GLOVE) ×6 IMPLANT
GOWN STRL REUS W/ TWL LRG LVL3 (GOWN DISPOSABLE) IMPLANT
GOWN STRL REUS W/ TWL XL LVL3 (GOWN DISPOSABLE) ×2 IMPLANT
GOWN STRL REUS W/TWL 2XL LVL3 (GOWN DISPOSABLE) IMPLANT
GOWN STRL REUS W/TWL LRG LVL3 (GOWN DISPOSABLE)
GOWN STRL REUS W/TWL XL LVL3 (GOWN DISPOSABLE) ×6
KIT BASIN OR (CUSTOM PROCEDURE TRAY) ×3 IMPLANT
KIT INFUSE XX SMALL 0.7CC (Orthopedic Implant) ×2 IMPLANT
KIT ROOM TURNOVER OR (KITS) ×3 IMPLANT
LIQUID BAND (GAUZE/BANDAGES/DRESSINGS) ×3 IMPLANT
NDL HYPO 21X1.5 SAFETY (NEEDLE) ×1 IMPLANT
NDL HYPO 25X1 1.5 SAFETY (NEEDLE) ×1 IMPLANT
NEEDLE HYPO 21X1.5 SAFETY (NEEDLE) ×3 IMPLANT
NEEDLE HYPO 25X1 1.5 SAFETY (NEEDLE) ×3 IMPLANT
NS IRRIG 1000ML POUR BTL (IV SOLUTION) ×3 IMPLANT
PACK LAMINECTOMY NEURO (CUSTOM PROCEDURE TRAY) ×3 IMPLANT
PAD ARMBOARD 7.5X6 YLW CONV (MISCELLANEOUS) ×9 IMPLANT
ROD REVERE 6.35 40MM (Rod) ×2 IMPLANT
ROD REVERE 6.35 45MM (Rod) ×2 IMPLANT
SCREW REVERE 5.5X45 (Screw) ×2 IMPLANT
SCREW SET BREAK OFF (Screw) ×4 IMPLANT
SPONGE LAP 4X18 X RAY DECT (DISPOSABLE) IMPLANT
SPONGE SURGIFOAM ABS GEL 100 (HEMOSTASIS) ×3 IMPLANT
STRIP CLOSURE SKIN 1/2X4 (GAUZE/BANDAGES/DRESSINGS) ×3 IMPLANT
SUT VIC AB 0 CT1 18XCR BRD8 (SUTURE) ×2 IMPLANT
SUT VIC AB 0 CT1 8-18 (SUTURE) ×6
SUT VIC AB 2-0 CT1 18 (SUTURE) ×3 IMPLANT
SUT VIC AB 4-0 PS2 27 (SUTURE) ×3 IMPLANT
SYR 20CC LL (SYRINGE) ×2 IMPLANT
TOWEL OR 17X24 6PK STRL BLUE (TOWEL DISPOSABLE) ×3 IMPLANT
TOWEL OR 17X26 10 PK STRL BLUE (TOWEL DISPOSABLE) ×3 IMPLANT
TRAY FOLEY W/METER SILVER 14FR (SET/KITS/TRAYS/PACK) ×3 IMPLANT
WATER STERILE IRR 1000ML POUR (IV SOLUTION) ×3 IMPLANT

## 2015-04-12 NOTE — Progress Notes (Signed)
PHARMACIST - PHYSICIAN ORDER COMMUNICATION  CONCERNING: P&T Medication Policy on Herbal Medications  DESCRIPTION:  This patient's order for:  Biotin  has been noted.  This product(s) is classified as an "herbal" or natural product. Due to a lack of definitive safety studies or FDA approval, nonstandard manufacturing practices, plus the potential risk of unknown drug-drug interactions while on inpatient medications, the Pharmacy and Therapeutics Committee does not permit the use of "herbal" or natural products of this type within [place].   ACTION TAKEN: The pharmacy department is unable to verify this order at this time and your patient has been informed of this safety policy. Please reevaluate patient's clinical condition at discharge and address if the herbal or natural product(s) should be resumed at that time.  Rey Dansby P. Tamas Suen, Pharm.D., BCPS Clinical Pharmacist  

## 2015-04-12 NOTE — Anesthesia Postprocedure Evaluation (Signed)
Anesthesia Post Note  Patient: Diana Hale  Procedure(s) Performed: Procedure(s) (LRB): Posterior  Lumbar Interbody Fusion - Lumbar two-Lumbar three Removal Lumbar three-Lumbar five 6.35 Legacy (N/A)  Patient location during evaluation: PACU Anesthesia Type: General Level of consciousness: awake and alert Pain management: pain level controlled Vital Signs Assessment: post-procedure vital signs reviewed and stable Respiratory status: spontaneous breathing, nonlabored ventilation, respiratory function stable and patient connected to nasal cannula oxygen Cardiovascular status: blood pressure returned to baseline and stable Postop Assessment: no signs of nausea or vomiting Anesthetic complications: no    Last Vitals:  Filed Vitals:   04/12/15 1224 04/12/15 1301  BP: 143/77 148/65  Pulse: 73 74  Temp: 36.7 C 37.3 C  Resp: 14 16    Last Pain:  Filed Vitals:   04/12/15 1440  PainSc: 3                  Kayin Kettering A

## 2015-04-12 NOTE — H&P (Signed)
Diana Hale is an 82 y.o. female.   Chief Complaint: Back and bilateral leg pain HPI: Patient is a 82 year old female has previously undergone an L3-S1 fusion and done very well however over the last 2 months as a progress worsening back and bilateral hip and leg pain radiating down medial and anterior quad workup has shown progressive segmental degeneration above her fusion at L2-3 with a disc herniation severe stenosis and instability. Due to patient's failed conservative treatment imaging findings and progression of clinical syndrome I recommended reexploration of fusion we will hardware and decompression and fusion at L2-3. I've extensively gone over the risks and benefits of the operation with the patient as well as perioperative course expectations of outcome and alternatives surgery and she understands and agrees to proceed forward. Over the last several days the patient has had an acute progressive deterioration with worsening of her pain both in her back in her leg. He was made aware upon entering the short stay the patient has been taking chronic Advil sinus medication. Totaling about 400-600 mg of Advil per day. After extensive discussions with the patient dispense him that she is done throughout her life with multiple surgeries and has never had a bleeding, location. I extensively gone over the risks of a postoperative Clarene Critchley with the patient and her daughter and that this is slightly higher with that medication on board. However due to her acute progression of the last week or so I do not feel that we have the luxury of treatment and this entirely electively. And due to her history we have decided to proceed forward.  Past Medical History  Diagnosis Date  . Diabetes mellitus   . Hypothyroidism   . Urinary frequency   . GERD (gastroesophageal reflux disease)   . Diverticulosis   . Arthritis   . Cancer ([place])     ovarian  . Heart murmur     ASYMPTOMATIC    Past Surgical History   Procedure Laterality Date  . Knee arthroscopy      bilat  . Shoulder arthroscopy      right  . Tonsillectomy    . Appendectomy    . Cholecystectomy    . Hernia repair  1988    hiatal  . Back surgery      1 ceviacl and 2 lumbar   . Cataract extraction Bilateral   . Carpal tunnel release Right   . Colonoscopy    . Esophagogastroduodenoscopy    . Abdominal hysterectomy    . Laparotomy    . Total hip arthroplasty Right 09/03/2014    Procedure: TOTAL HIP ARTHROPLASTY ANTERIOR APPROACH;  Surgeon: Hessie Knows, MD;  Location: [facility] ORS;  Service: Orthopedics;  Laterality: Right;    Family History  Problem Relation Age of Onset  . Tuberculosis Father    Social History:  reports that she has quit smoking. She has never used smokeless tobacco. She reports that she does not drink alcohol or use illicit drugs.  Allergies:  Allergies  Allergen Reactions  . Citalopram Itching  . Demerol [Meperidine] Other (See Comments)    agitation  . Fentanyl   . Lipitor [Atorvastatin] Other (See Comments)    headache  . Losartan Other (See Comments)    headache  . Lovastatin Other (See Comments)    Muscle pain  . Metoclopramide Hcl Other (See Comments)    "made her feel wired"  . Neurontin [Gabapentin]   . Pravastatin Other (See Comments)    Muscle pain  .  Trazodone And Nefazodone     Medications Prior to Admission  Medication Sig Dispense Refill  . azelastine (ASTEPRO) 137 MCG/SPRAY nasal spray Place 1 spray into the nose 2 (two) times daily. Use in each nostril as directed    . B Complex-Folic Acid (B COMPLEX-VITAMIN B12 PO) Take 2,500 mcg by mouth daily.    . Biotin 5000 MCG TABS Take 1 tablet by mouth daily.    . Cholecalciferol 2000 UNITS CAPS Take 1 capsule by mouth daily.    . Cyanocobalamin (VITAMIN B-12) 2500 MCG SUBL Place 1 tablet under the tongue daily.    Marland Kitchen FOLIC ACID PO Take 1 tablet by mouth daily.    . insulin aspart protamine-insulin aspart (NOVOLOG 70/30) (70-30) 100  UNIT/ML injection Inject 60 Units into the skin 2 (two) times daily with a meal. Sliding scale    . levothyroxine (SYNTHROID, LEVOTHROID) 100 MCG tablet Take 100 mcg by mouth daily before breakfast.    . Oxycodone HCl 10 MG TABS Take 1 tablet (10 mg total) by mouth at bedtime as needed. 1-2 tablets by mouth at bedtime as needed for pain (Patient taking differently: Take 10 mg by mouth every 4 (four) hours as needed. 1-2 tablets by mouth at bedtime as needed for pain) 21 tablet 0  . pantoprazole (PROTONIX) 40 MG tablet Take 40 mg by mouth 2 (two) times daily.    . Pseudoephedrine-Ibuprofen (ADVIL COLD/SINUS PO) Take 1 tablet by mouth 3 (three) times daily. Reports that she takes 2- 3 times a day    . cyclobenzaprine (FLEXERIL) 10 MG tablet Take 10 mg by mouth 2 (two) times daily as needed for muscle spasms.    . polyethylene glycol (MIRALAX / GLYCOLAX) packet Take 17 g by mouth 2 (two) times daily.      Results for orders placed or performed during the hospital encounter of 04/12/15 (from the past 48 hour(s))  Glucose, capillary     Status: Abnormal   Collection Time: 04/12/15  6:54 AM  Result Value Ref Range   Glucose-Capillary 128 (H) 65 - 99 mg/dL   No results found.  Review of Systems  Constitutional: Negative.   [name]: Negative.   Eyes: Negative.   Respiratory: Negative.   Cardiovascular: Negative.   Gastrointestinal: Negative.   Genitourinary: Negative.   Musculoskeletal: Positive for myalgias, back pain and joint pain.  Skin: Negative.   Neurological: Positive for tingling and sensory change.  Psychiatric/Behavioral: Negative.     Blood pressure 169/61, pulse 87, temperature 99.4 F (37.4 C), temperature source Oral, resp. rate 18, height 5\' 5"  (1.651 m), weight 97.523 kg (215 lb), SpO2 97 %. Physical Exam  Constitutional: She is oriented to person, place, and time. She appears well-developed and well-nourished.  [name]:  Head: Normocephalic.  Eyes: Pupils are equal, round,  and reactive to light.  Neck: Normal range of motion.  Respiratory: Effort normal and breath sounds normal.  GI: Soft. Bowel sounds are normal.  Neurological: She is alert and oriented to person, place, and time. She has normal strength. GCS eye subscore is 4. GCS verbal subscore is 5. GCS motor subscore is 6.  5 out of 5 in her iliopsoas, quads, hamstrings, gastric, anterior tibialis, EHL.  Skin: Skin is warm and dry.     Assessment/Plan 82 year old female presents for decompression stabilization procedure at L2-3  Nakira Litzau P, MD 04/12/2015, 7:21 AM

## 2015-04-12 NOTE — Progress Notes (Addendum)
Repaged Dr. Saintclair Halsted. Spoke to Dr. Saintclair Halsted regarding patient using advil cold and sinus

## 2015-04-12 NOTE — Anesthesia Preprocedure Evaluation (Addendum)
Anesthesia Evaluation  Patient identified by MRN, date of birth, ID band Patient awake    Reviewed: Allergy & Precautions, NPO status , Patient's Chart, lab work & pertinent test results  Airway Mallampati: II  TM Distance: >3 FB Neck ROM: Full    Dental no notable dental hx. (+) Dental Advisory Given   Pulmonary former smoker,    Pulmonary exam normal breath sounds clear to auscultation       Cardiovascular negative cardio ROS Normal cardiovascular exam+ Valvular Problems/Murmurs  Rhythm:Regular Rate:Normal     Neuro/Psych negative neurological ROS  negative psych ROS   GI/Hepatic Neg liver ROS, GERD  Medicated,  Endo/Other  diabetes, Insulin DependentHypothyroidism   Renal/GU negative Renal ROS  negative genitourinary   Musculoskeletal  (+) Arthritis , Osteoarthritis,    Abdominal   Peds negative pediatric ROS (+)  Hematology negative hematology ROS (+)   Anesthesia Other Findings   Reproductive/Obstetrics negative OB ROS                            Anesthesia Physical Anesthesia Plan  ASA: II  Anesthesia Plan: General   Post-op Pain Management:    Induction: Intravenous  Airway Management Planned: Oral ETT  Additional Equipment:   Intra-op Plan:   Post-operative Plan: Extubation in OR  Informed Consent: I have reviewed the patients History and Physical, chart, labs and discussed the procedure including the risks, benefits and alternatives for the proposed anesthesia with the patient or authorized representative who has indicated his/her understanding and acceptance.   Dental advisory given  Plan Discussed with: CRNA  Anesthesia Plan Comments:         Anesthesia Quick Evaluation

## 2015-04-12 NOTE — Transfer of Care (Signed)
Immediate Anesthesia Transfer of Care Note  Patient: Diana Hale  Procedure(s) Performed: Procedure(s): Posterior  Lumbar Interbody Fusion - Lumbar two-Lumbar three Removal Lumbar three-Lumbar five 6.35 Legacy (N/A)  Patient Location: PACU  Anesthesia Type:General  Level of Consciousness: awake, alert , oriented and patient cooperative  Airway & Oxygen Therapy: Patient Spontanous Breathing and Patient connected to nasal cannula oxygen  Post-op Assessment: Report given to RN and Post -op Vital signs reviewed and stable  Post vital signs: Reviewed and stable  Last Vitals:  Filed Vitals:   04/12/15 0651  BP: 169/61  Pulse: 87  Temp: 37.4 C  Resp: 18    Complications: No apparent anesthesia complications

## 2015-04-12 NOTE — Op Note (Signed)
Preoperative diagnosis: Segmental degeneration and lumbar spinal stenosis L2-3 with herniated nucleus pulposus L2-3 lumbar instability L2-3 and bilateral L2 and L3 radiculopathies  Postoperative diagnosis: Same  Procedure: #1 reexploration of fusion we will hardware L3-L5 with removal of knots rods and L4 and L5 screws from the 6.35 Legacy pedicle screw system. With retention of bilateral L3 screws fusion appeared to be solid.  #2 decompressive lumbar laminectomy L2-3 in excess and requiring more work than would be needed with a standard interbody fusion with complete facetectomies radical foraminotomies of the L2 and L3 nerve roots.  #3 posterior lumbar interbody fusion L2-3 utilizing the globus rise expandable cage system packed with locally harvested autograft mixed with allostem morsels and BMP  #4 screw fixation with bilateral 5 5 x 45 globus Revere 6.35 pedicle screws inserted at L2 bilaterally and connecting up with L3 for an L2-3 pedicle screw fixation.  #5 posterior lateral arthrodesis using the locally harvested autograft mixed along with BMP  Surgeon: Dominica Severin Tanya Crothers  Asst.: Mallie Mussel pool  Anesthesia: Gen.  EBL: Middle  History of present illness: Patient is a very pleasant 82 year old female previously undergone L3-L5 fusion initially did very well however over the last several weeks and months of progressive varus and bilateral leg pain radiating down L2 and L3 nerve root pattern. Workup revealed progressive and segmental degeneration L2-3 above her previous fusion. Due to her failure conservative treatment imaging findings and progressive clinical syndrome I recommended decompression stabilization procedure at L2-3. I extensively reviewed the risks and benefits of the procedure the patient as well as perioperative course expectations of outcome and alternatives of surgery and she understood and agreed to proceed forward.  Operative procedure: Patient brought into the or was induced under  general anesthesia positioned prone Jackson table her back was prepped and draped in routine sterile fashion her old incision was prepped and draped in routine sterile fashion incision was infiltrated with 10 mL lidocaine with epi and extended slightly cephalad subperiosteal dissections care lamina of L2 exposing the pedicle TPs at L2 as well as the old fusion construct. I inspected the fusion did appear to be solid disconnected the rods removed the rods removed the L4 and L5 screws bilaterally and again reinspected effusion again which appear to be solid. Then I identified the spinous processes residua of L2 and removed this in piecemeal fashion performing a central decompression. Complete facetectomies were removed and dissected through through the scar tissue and radical foraminotomies of the L2 and L3 nerve roots were carried out with aggressive undermining of the superior tickling process of L3. Epidural veins are coagulated there was a large disc herniation on that it was migrated inferiorly against the pedicle displacing the right L3 nerve root this was incised and removed this was then cleaned out bilaterally. Using fluoroscopy pilot holes were drilled at L2 cannulated with the awl probed With a 45 Probed again and 5 5 x 45 screws inserted at L2 bilaterally. Then working in the interbody disc space is cleaned out and smoothed with sequential distraction I removed a large posterior osteophyte cleaning out the endplates (the disc space. I selected 11-17 expandable 20 mm in length cages there are packed with local autograft mixed. Then they were inserted with the local autograft as well as BMP packed anteriorly and medially. After both cages were inserted both cages were expanded to a comparable level the disc was markedly expanded and thus reducing the deformity as well as opening up the L2 and L3 foramina. [place] was irrigated meticulous in space was maintained aggressive decortication was care MTPs or  lateral gutters the local autograft mixed and BMP was packed posterior laterally 45 rod was placed the left and 40 on the right top tightening nuts were anchored in place with 6.35 kn at L3 and globus knots at L2 then all the foraminal reinspected confirm patency no migration of graft material. Gelfoam was opened up the dura the the wound was sprayed with vancomycin powder a large abductor was placed the wounds closed in layers with after Vicryl experell was injected in the fascia the skin was closed running 4 subcuticular Dermabond benzo and Steri-Strips and sterile dressing were applied patient recovered in stable condition. At the end of case all needle counts sponge counts were correct.

## 2015-04-12 NOTE — Progress Notes (Addendum)
When going through patient's home medications she reports that she forgot to tell us about advil cold and sinus. She reports that she has been taking 2- 3 pills a day for at least one month (per drug information each pill contains 200 mg of ibuprofen). Will contact Dr. Saintclair Halsted regarding same. Notified Dr. Aris Lot anesthesiologist and have paged Dr. Saintclair Halsted

## 2015-04-12 NOTE — Anesthesia Procedure Notes (Addendum)
Procedure Name: Intubation Date/Time: 04/12/2015 7:51 AM Performed by: Salli Quarry Peg Fifer Pre-anesthesia Checklist: Patient identified, Emergency Drugs available, Suction available and Patient being monitored Patient Re-evaluated:Patient Re-evaluated prior to inductionOxygen Delivery Method: Circle system utilized Preoxygenation: Pre-oxygenation with 100% oxygen Intubation Type: IV induction Ventilation: Mask ventilation without difficulty and Oral airway inserted - appropriate to patient size Laryngoscope Size: Mac and 3 Grade View: Grade I Tube type: Oral Tube size: 7.5 mm Number of attempts: 1 Airway Equipment and Method: Stylet Placement Confirmation: ETT inserted through vocal cords under direct vision,  positive ETCO2 and breath sounds checked- equal and bilateral Secured at: 20 cm Tube secured with: Tape Dental Injury: Teeth and Oropharynx as per pre-operative assessment

## 2015-04-12 NOTE — Progress Notes (Signed)
Utilization review completed.  

## 2015-04-12 NOTE — Progress Notes (Signed)
Patient arrived from PACU to 5C21. Safety precautions and orders reviewed with patient. ICS encourage. PCA reviewed and side effects explained at this time. Patient c/o minimal pain . Will continue to monitor.  Ave Filter, RN

## 2015-04-13 LAB — GLUCOSE, CAPILLARY
GLUCOSE-CAPILLARY: 318 mg/dL — AB (ref 65–99)
GLUCOSE-CAPILLARY: 241 mg/dL — AB (ref 65–99)
Glucose-Capillary: 189 mg/dL — ABNORMAL HIGH (ref 65–99)
Glucose-Capillary: 126 mg/dL — ABNORMAL HIGH (ref 65–99)

## 2015-04-13 MED ORDER — HYDROMORPHONE HCL 1 MG/ML IJ SOLN
0.5000 mg | INTRAMUSCULAR | Status: DC | PRN
  Administered 2015-04-13 – 2015-04-16 (×14): 0.5 mg via INTRAVENOUS
  Filled 2015-04-13 (×14): qty 1

## 2015-04-13 MED ORDER — HYDROMORPHONE HCL 2 MG PO TABS
2.0000 mg | ORAL_TABLET | ORAL | Status: DC | PRN
  Administered 2015-04-13 – 2015-04-15 (×8): 2 mg via ORAL
  Filled 2015-04-13 (×8): qty 1

## 2015-04-13 MED ORDER — ASPIRIN 325 MG PO TABS
ORAL_TABLET | ORAL | Status: AC
  Administered 2015-04-13: 15:00:00
  Filled 2015-04-13: qty 1

## 2015-04-13 NOTE — Progress Notes (Signed)
CSW consult for SNF. Per PT, no PT follow up or DME needed. CSW signing off. Please re-consult is CSW needs arise.   Wandra Feinstein, MSW, [employer] [phone]

## 2015-04-13 NOTE — Evaluation (Signed)
Occupational Therapy Evaluation Patient Details Name: Diana Hale MRN: [id number] DOB: 04-24-1931 Today's Date: 04/13/2015    History of Present Illness Adm with back and bil leg pain; s/p removal of hardware L4-5, L2-3 laminectomies and PLIF PMHx- back & neck surgery, Rt THR, DM, urinary incontinence (wears "diapers" per pt), ovarian Ca   Clinical Impression   Pt admitted with above diagnosis and has the deficits listed below. Pt would benefit from cont OT to increase independence with basic adls and adls transfers so she can eventually d/c home after a short stay with her daughter.  Daughter is available 24//7 to assist pt.      Follow Up Recommendations  No OT follow up;Supervision/Assistance - 24 hour    Equipment Recommendations  None recommended by OT    Recommendations for Other Services       Precautions / Restrictions Precautions Precautions: Back;Anterior Hip;Fall Precaution Booklet Issued: Yes (comment) Precaution Comments: educated with handout and demonstration Required Braces or Orthoses: Spinal Brace Spinal Brace: Lumbar corset;Applied in sitting position Restrictions Weight Bearing Restrictions: No      Mobility Bed Mobility Overal bed mobility: Needs Assistance Bed Mobility: Rolling;Sidelying to Sit;Sit to Sidelying Rolling: Supervision Sidelying to sit: Min assist     Sit to sidelying: Min assist General bed mobility comments: + rail, HOB flat; vc for technique, some assist raising torso and later legs  Transfers Overall transfer level: Needs assistance Equipment used: Rolling walker (2 wheeled) Transfers: Sit to/from Stand Sit to Stand: Min guard         General transfer comment: Cues for hand placment and bed up slighly.    Balance Overall balance assessment: Needs assistance Sitting-balance support: Feet supported Sitting balance-Leahy Scale: Good     Standing balance support: During functional activity;Bilateral upper extremity  supported Standing balance-Leahy Scale: Good Standing balance comment: at sink grooming and in bathroom cleaning self.                            ADL Overall ADL's : Needs assistance/impaired Eating/Feeding: Independent;Sitting   Grooming: Wash/dry face;Wash/dry hands;Oral care;Supervision/safety;Standing Grooming Details (indicate cue type and reason): Pt stood at sink to groom.  Pt did not need physical assist.  A few cues given for back precautions. Upper Body Bathing: Set up;Sitting   Lower Body Bathing: Minimal assistance;Sit to/from stand Lower Body Bathing Details (indicate cue type and reason): min assist to reach below her knees. pt has adaptive equipment in shower at home. Upper Body Dressing : Set up;Sitting (including brace.)   Lower Body Dressing: Moderate assistance;Cueing for back precautions;Sit to/from stand (cues to use reacher to donn pants) Lower Body Dressing Details (indicate cue type and reason): cued pt to use reacher to donn pants at home. Pt has reacher and sock aid to assist with dressing at home. Toilet Transfer: Supervision/safety;Cueing for Estate manager/land agent Details (indicate cue type and reason): pt walked to 3:1 over commode.  No physical assist needed but cues for back precautions and hand placement. Toileting- Clothing Manipulation and Hygiene: Supervision/safety;Sit to/from stand       Functional mobility during ADLs: Min guard;Rolling walker General ADL Comments: Pt did very well with adls. This is not pts first back surgery so she is familiar with dressing technqiues.     Vision Vision Assessment?: No apparent visual deficits   Perception     Praxis      Pertinent Vitals/Pain Pain Assessment: 0-10 Pain Score: 7  Pain Location: back Pain Descriptors / Indicators: Operative site guarding Pain Intervention(s): Limited activity within patient's tolerance;Monitored during session;Premedicated before  session;Repositioned     Hand Dominance Right   Extremity/Trunk Assessment Upper Extremity Assessment Upper Extremity Assessment: Overall WFL for tasks assessed   Lower Extremity Assessment Lower Extremity Assessment: Defer to PT evaluation   Cervical / Trunk Assessment Cervical / Trunk Assessment: Kyphotic   Communication Communication Communication: No difficulties   Cognition Arousal/Alertness: Awake/alert Behavior During Therapy: WFL for tasks assessed/performed Overall Cognitive Status: Within Functional Limits for tasks assessed                     General Comments       Exercises       Shoulder Instructions      Home Living Family/patient expects to be discharged to:: Private residence Living Arrangements: Alone Available Help at Discharge: Family;Available 24 hours/day (will d/c to daughter's home) Type of Home: House Home Access: Stairs to enter Entrance Stairs-Number of Steps: 3 Entrance Stairs-Rails: Left Home Layout: One level     Bathroom Shower/Tub: Tub/shower unit;Curtain Shower/tub characteristics: Architectural technologist: Standard Bathroom Accessibility: Yes How Accessible: Accessible via walker Home Equipment: [place] - 2 wheels;Bedside commode;Adaptive equipment;Grab bars - tub/shower Adaptive Equipment: Reacher;Long-handled shoe horn;Sock aid Additional Comments: Pt uses adaptive equipment to dress since hip surgery.      Prior Functioning/Environment Level of Independence: Needs assistance  Gait / Transfers Assistance Needed: uses RW all the time due to back pain ADL's / Homemaking Assistance Needed: family assists grocery store and housework        OT Diagnosis: Generalized weakness;Acute pain   OT Problem List: Decreased activity tolerance;Impaired balance (sitting and/or standing);Decreased knowledge of use of DME or AE;Pain   OT Treatment/Interventions: Self-care/ADL training;DME and/or AE instruction;Therapeutic  activities    OT Goals(Current goals can be found in the care plan section) Acute Rehab OT Goals Patient Stated Goal: ultimately return to her home (alone) OT Goal Formulation: With patient Time For Goal Achievement: 04/27/15 Potential to Achieve Goals: Good ADL Goals Pt Will Perform Tub/Shower Transfer: Tub transfer;with supervision;ambulating;3 in 1;rolling walker Additional ADL Goal #1: Pt will walk to bathroom and toilet with 3:1 over commode with S. Additional ADL Goal #2: Pt will dress LE using reacher and sock aid and gathering clothes with S.  OT Frequency: Min 2X/week   Barriers to D/C:    d/cing home with daughter       Co-evaluation              End of Session Equipment Utilized During Treatment: Rolling walker;Back brace Nurse Communication: Mobility status  Activity Tolerance: Patient limited by pain Patient left: in bed;with call bell/phone within reach   Time: 1100-1121 OT Time Calculation (min): 21 min Charges:  OT General Charges $OT Visit: 1 Procedure OT Evaluation $OT Eval Moderate Complexity: 1 Procedure G-Codes:    Glenford Peers 2015/05/04, 11:37 AM  [phone]

## 2015-04-13 NOTE — Care Management Note (Signed)
Case Management Note  Patient Details  Name: Diana Hale MRN: [id number] Date of Birth: [birth year]  Subjective/Objective:                    Action/Plan: Patient was admitted for a PLIF. Lives at home alone. Will follow for discharge needs pending PT/OT evals and physician orders. Expected Discharge Date:                  Expected Discharge Plan:     In-House Referral:     Discharge planning Services     Post Acute Care Choice:    Choice offered to:     DME Arranged:    DME Agency:     HH Arranged:    HH Agency:     Status of Service:  In process, will continue to follow  Medicare Important Message Given:    Date Medicare IM Given:    Medicare IM give by:    Date Additional Medicare IM Given:    Additional Medicare Important Message give by:     If discussed at [place] of Stay Meetings, dates discussed:    Additional Comments:  Rolm Baptise, RN 04/13/2015, 10:42 AM [phone]

## 2015-04-13 NOTE — Evaluation (Addendum)
Physical Therapy Evaluation Patient Details Name: Diana Hale MRN: [id number] DOB: [birth year] Today's Date: 04/13/2015   History of Present Illness  Adm with back and bil leg pain; s/p removal of hardware L4-5, L2-3 laminectomies and PLIF PMHx- back & neck surgery, Rt THR, DM, urinary incontinence (wears "diapers" per pt), ovarian Ca  Clinical Impression  Patient is s/p above surgery resulting in the deficits listed below (see PT Problem List).  Patient will benefit from skilled PT to increase their independence and safety with mobility (while adhering to their precautions) to allow discharge to the venue listed below.     Follow Up Recommendations No PT follow up;Supervision for mobility/OOB    Equipment Recommendations  None recommended by PT    Recommendations for Other Services OT consult     Precautions / Restrictions Precautions Precautions: Back;Anterior Hip;Fall (Anterior hip per previous med records) Precaution Booklet Issued: Yes (comment) Precaution Comments: educated with handout and demonstration Required Braces or Orthoses: Spinal Brace Spinal Brace: Lumbar corset;Applied in sitting position      Mobility  Bed Mobility Overal bed mobility: Needs Assistance Bed Mobility: Rolling;Sidelying to Sit;Sit to Sidelying Rolling: Supervision Sidelying to sit: Min assist     Sit to sidelying: Min assist General bed mobility comments: + rail, HOB flat; vc for technique, some assist raising torso and later legs  Transfers Overall transfer level: Needs assistance Equipment used: Rolling walker (2 wheeled) Transfers: Sit to/from Stand Sit to Stand: Min assist         General transfer comment: x3; vc for safe use of RW, assist for hip/knee extension initially due to weakness  Ambulation/Gait Ambulation/Gait assistance: Min guard Ambulation Distance (Feet): 200 Feet Assistive device: Rolling walker (2 wheeled) Gait Pattern/deviations: Step-through  pattern;Decreased stride length   Gait velocity interpretation: Below normal speed for age/gender General Gait Details: kyphotic posture with ability to mostly reverse with cues; vc for safe use of RW  Stairs            Wheelchair Mobility    Modified Rankin (Stroke Patients Only)       Balance Overall balance assessment: Needs assistance Sitting-balance support: No upper extremity supported;Feet supported Sitting balance-Leahy Scale: Good     Standing balance support: No upper extremity supported Standing balance-Leahy Scale: Good Standing balance comment: at sink washing hands                             Pertinent Vitals/Pain Pain Assessment: 0-10 Pain Score: 4  Pain Location: back Pain Descriptors / Indicators: Operative site guarding    Home Living Family/patient expects to be discharged to:: Private residence Living Arrangements: Alone Available Help at Discharge: Family (daughter) Type of Home: House (Daughter's house) Home Access: Stairs to enter Entrance Stairs-Rails: Left Entrance Stairs-Number of Steps: 3 Home Layout: One level Home Equipment: Environmental consultant - 2 wheels;Bedside commode;Adaptive equipment      Prior Function Level of Independence: Needs assistance   Gait / Transfers Assistance Needed: uses RW all the time due to back pain  ADL's / Homemaking Assistance Needed: family assists grocery store and housework        Hand Dominance        Extremity/Trunk Assessment   Upper Extremity Assessment: Defer to OT evaluation;Overall WFL for tasks assessed           Lower Extremity Assessment: Generalized weakness (denies hip or leg pain)      Cervical / Trunk Assessment: Kyphotic  Communication   Communication: No difficulties  Cognition Arousal/Alertness: Awake/alert Behavior During Therapy: WFL for tasks assessed/performed Overall Cognitive Status: Within Functional Limits for tasks assessed                       General Comments      Exercises        Assessment/Plan    PT Assessment Patient needs continued PT services  PT Diagnosis Difficulty walking;Acute pain   PT Problem List Decreased mobility;Decreased knowledge of use of DME;Decreased knowledge of precautions;Pain  PT Treatment Interventions DME instruction;Gait training;Stair training;Functional mobility training;Therapeutic activities;Patient/family education   PT Goals (Current goals can be found in the Care Plan section) Acute Rehab PT Goals Patient Stated Goal: ultimately return to her home (alone) PT Goal Formulation: With patient Time For Goal Achievement: 04/17/15 Potential to Achieve Goals: Good    Frequency Min 5X/week   Barriers to discharge        Co-evaluation               End of Session Equipment Utilized During Treatment: Gait belt;Back brace Activity Tolerance: Patient tolerated treatment well Patient left: in bed;with call bell/phone within reach (had been up x 2 hrs ~45 minutes prior to arrival) Nurse Communication: Mobility status (3 "drips" of blood on sheets after return to bed; ?source)         Time: [id number] PT Time Calculation (min) (ACUTE ONLY): 42 min   Charges:   PT Evaluation $PT Eval Moderate Complexity: 1 Procedure PT Treatments $Gait Training: 8-22 mins $Therapeutic Activity: 8-22 mins   PT G Codes:        Chosen Geske 2015-05-03, 10:03 AM Pager [phone]

## 2015-04-13 NOTE — Progress Notes (Signed)
Subjective: Patient reports Back pain  Objective: Vital signs in last 24 hours: Temp:  [97.5 F (36.4 C)-99.1 F (37.3 C)] 98.4 F (36.9 C) (02/28 0552) Pulse Rate:  [71-83] 76 (02/28 0552) Resp:  [0-22] 16 (02/28 0552) BP: (107-158)/(46-77) 142/53 mmHg (02/28 0552) SpO2:  [97 %-100 %] 99 % (02/28 0552)  Intake/Output from previous day: 02/27 0701 - 02/28 0700 In: 2510 [P.O.:960; I.V.:1500; IV Piggyback:50] Out: [id number] [Urine:1070; Drains:355; Blood:250] Intake/Output this shift:    Significantly improved back and leg pain strength 5 out of 5 wound clean dry and intact  Lab Results: No results for input(s): WBC, HGB, HCT, PLT in the last 72 hours. BMET No results for input(s): NA, K, CL, CO2, GLUCOSE, BUN, CREATININE, CALCIUM in the last 72 hours.  Studies/Results: Dg Lumbar Spine 2-3 Views  04/12/2015  CLINICAL DATA:  Lumbar fusion . EXAM: LUMBAR SPINE - 2-3 VIEW; DG C-ARM 61-120 MIN COMPARISON:  06/02/2011 . FINDINGS: Lumbar vertebra difficult and number due to position. Patient has undergone prior lumbar pedicle screws placement and inter disc fusion. Surgical sponge noted of the posterior back. Prior lower lumbar inter disc fusion . IMPRESSION: Postsurgical changes lumbar spine. Electronically Signed   By: Marcello Moores  Register   On: 04/12/2015 10:47   Dg C-arm 1-60 Min  04/12/2015  CLINICAL DATA:  Lumbar fusion . EXAM: LUMBAR SPINE - 2-3 VIEW; DG C-ARM 61-120 MIN COMPARISON:  06/02/2011 . FINDINGS: Lumbar vertebra difficult and number due to position. Patient has undergone prior lumbar pedicle screws placement and inter disc fusion. Surgical sponge noted of the posterior back. Prior lower lumbar inter disc fusion . IMPRESSION: Postsurgical changes lumbar spine. Electronically Signed   By: Marcello Moores  Register   On: 04/12/2015 10:47    Assessment/Plan: Mobilization today with physical occupational therapy. [place] her PCA.  LOS: 1 day     Launa Goedken P 04/13/2015, 7:37 AM

## 2015-04-13 NOTE — Progress Notes (Signed)
Inpatient Diabetes Program Recommendations  AACE/ADA: New Consensus Statement on Inpatient Glycemic Control (2015)  Target Ranges:  Prepandial:   less than 140 mg/dL      Peak postprandial:   less than 180 mg/dL (1-2 hours)      Critically ill patients:  140 - 180 mg/dL  Results for Diana Hale, Diana Hale (MRN [id number]) as of 04/13/2015 08:44  Ref. Range 04/12/2015 11:52 04/12/2015 16:34 04/12/2015 22:19  Glucose-Capillary Latest Ref Range: 65-99 mg/dL 215 (H) 304 (H) 215 (H)  Results for Diana Hale, Diana Hale (MRN [id number]) as of 04/13/2015 08:44  Ref. Range 04/07/2015 14:54  Hemoglobin A1C Latest Ref Range: 4.8-5.6 % 7.8 (H)  Glucose Latest Ref Range: 65-99 mg/dL 319 (H)   Review of Glycemic Control  Diabetes history: DM2 Outpatient Diabetes medications: 70/30 60 units BID Current orders for Inpatient glycemic control: 70/30 60 units BID  Inpatient Diabetes Program Recommendations: Correction (SSI): While inpatient, please consider ordering CBGs with Novolog sensitive correction scale ACHS (in addition to 70/30).  Thanks, Barnie Alderman, RN, MSN, CDE Diabetes Coordinator Inpatient Diabetes Program [phone] (Team Pager from [place] to [place]) [phone] ([place] office) [phone] [facility] [address] office) [phone] [facility] office)

## 2015-04-14 LAB — GLUCOSE, CAPILLARY
GLUCOSE-CAPILLARY: 102 mg/dL — AB (ref 65–99)
GLUCOSE-CAPILLARY: 145 mg/dL — AB (ref 65–99)
GLUCOSE-CAPILLARY: 98 mg/dL (ref 65–99)
Glucose-Capillary: 190 mg/dL — ABNORMAL HIGH (ref 65–99)

## 2015-04-14 MED ORDER — METHOCARBAMOL 1000 MG/10ML IJ SOLN: 1000.0000 mg | Freq: Four times a day (QID) | INTRAVENOUS | Status: DC | PRN

## 2015-04-14 MED ORDER — KETOROLAC TROMETHAMINE 15 MG/ML IJ SOLN
15.0000 mg | Freq: Four times a day (QID) | INTRAMUSCULAR | Status: AC
  Administered 2015-04-14 (×4): 15 mg via INTRAVENOUS
  Filled 2015-04-14 (×4): qty 1

## 2015-04-14 NOTE — Progress Notes (Signed)
Physical Therapy Treatment Patient Details Name: Diana Hale MRN: [id number] DOB: [birth year] Today's Date: 04/14/2015    History of Present Illness Adm with back and bil leg pain; s/p removal of hardware L4-5, L2-3 laminectomies and PLIF PMHx- back & neck surgery, Rt THR, DM, urinary incontinence (wears "diapers" per pt), ovarian Ca    PT Comments    Upon arrival, pt sidelying in bed. Educated pt on use of pillow between knees for better positioning of the spine.  Follow Up Recommendations  No PT follow up;Supervision for mobility/OOB     Equipment Recommendations  None recommended by PT    Recommendations for Other Services       Precautions / Restrictions Precautions Precautions: Back;Anterior Hip;Fall Precaution Comments: Reviewed precautions. Pt independently recalled 3/3 back precautions. Required Braces or Orthoses: Spinal Brace Spinal Brace: Lumbar corset;Applied in sitting position    Mobility  Bed Mobility       Sidelying to sit: Min assist;HOB elevated       General bed mobility comments: use of rail, verbal cues for sequencing  Transfers   Equipment used: Rolling walker (2 wheeled)   Sit to Stand: Min guard         General transfer comment: Pt demo good technique.  Ambulation/Gait Ambulation/Gait assistance: Min guard Ambulation Distance (Feet): 240 Feet Assistive device: Rolling walker (2 wheeled) Gait Pattern/deviations: Step-through pattern;Decreased stride length Gait velocity: decreased Gait velocity interpretation: Below normal speed for age/gender General Gait Details: kyphotic posture with ability to mostly reverse with cues   Stairs            Wheelchair Mobility    Modified Rankin (Stroke Patients Only)       Balance                                    Cognition Arousal/Alertness: Awake/alert Behavior During Therapy: WFL for tasks assessed/performed Overall Cognitive Status: Within Functional Limits  for tasks assessed                      Exercises      General Comments        Pertinent Vitals/Pain Pain Assessment: 0-10 Pain Score: 4  Pain Location: back Pain Descriptors / Indicators: Sore Pain Intervention(s): Monitored during session;RN gave pain meds during session;Repositioned    Home Living                      Prior Function            PT Goals (current goals can now be found in the care plan section) Acute Rehab PT Goals Patient Stated Goal: ultimately return to her home (alone) PT Goal Formulation: With patient Time For Goal Achievement: 04/17/15 Potential to Achieve Goals: Good Progress towards PT goals: Progressing toward goals    Frequency  Min 5X/week    PT Plan Current plan remains appropriate    Co-evaluation             End of Session Equipment Utilized During Treatment: Gait belt;Back brace Activity Tolerance: Patient tolerated treatment well Patient left: in chair;with call bell/phone within reach     Time: 0846-0903 PT Time Calculation (min) (ACUTE ONLY): 17 min  Charges:  $Gait Training: 8-22 mins                    G Codes:  Lorriane Shire 04/14/2015, 9:07 AM

## 2015-04-14 NOTE — Progress Notes (Signed)
Subjective: Patient reports Condition severe back pain this morning refractory to oral pain medication.  Objective: Vital signs in last 24 hours: Temp:  [98.9 F (37.2 C)-101.3 F (38.5 C)] 99.2 F (37.3 C) (03/01 0407) Pulse Rate:  [91-102] 99 (03/01 0407) Resp:  [16-20] 18 (03/01 0407) BP: (112-141)/(31-58) 128/38 mmHg (03/01 0407) SpO2:  [92 %-100 %] 92 % (03/01 0407)  Intake/Output from previous day: 02/28 0701 - 03/01 0700 In: 870 [P.O.:720; IV Piggyback:50] Out: 165 [Drains:165] Intake/Output this shift:    Strength 5 out of 5 wound clean dry and intact  Lab Results: No results for input(s): WBC, HGB, HCT, PLT in the last 72 hours. BMET No results for input(s): NA, K, CL, CO2, GLUCOSE, BUN, CREATININE, CALCIUM in the last 72 hours.  Studies/Results: Dg Lumbar Spine 2-3 Views  04/12/2015  CLINICAL DATA:  Lumbar fusion . EXAM: LUMBAR SPINE - 2-3 VIEW; DG C-ARM 61-120 MIN COMPARISON:  06/02/2011 . FINDINGS: Lumbar vertebra difficult and number due to position. Patient has undergone prior lumbar pedicle screws placement and inter disc fusion. Surgical sponge noted of the posterior back. Prior lower lumbar inter disc fusion . IMPRESSION: Postsurgical changes lumbar spine. Electronically Signed   By: Marcello Moores  Register   On: 04/12/2015 10:47   Dg C-arm 1-60 Min  04/12/2015  CLINICAL DATA:  Lumbar fusion . EXAM: LUMBAR SPINE - 2-3 VIEW; DG C-ARM 61-120 MIN COMPARISON:  06/02/2011 . FINDINGS: Lumbar vertebra difficult and number due to position. Patient has undergone prior lumbar pedicle screws placement and inter disc fusion. Surgical sponge noted of the posterior back. Prior lower lumbar inter disc fusion . IMPRESSION: Postsurgical changes lumbar spine. Electronically Signed   By: Marcello Moores  Register   On: 04/12/2015 10:47    Assessment/Plan: Continue physical occupational therapy we'll try a couple doses of Toradol and IV Robaxin for pain management.  LOS: 2 days     Hughes Wyndham  P 04/14/2015, 8:08 AM

## 2015-04-14 NOTE — Progress Notes (Signed)
Occupational Therapy Treatment Patient Details Name: Diana Hale MRN: [id number] DOB: 10-Dec-1931 Today's Date: 04/14/2015    History of present illness Adm with back and bil leg pain; s/p removal of hardware L4-5, L2-3 laminectomies and PLIF PMHx- back & neck surgery, Rt THR, DM, urinary incontinence (wears "diapers" per pt), ovarian Ca   OT comments  Pt making good progress toward OT goal this session. Educated pt on tub transfer technique and use of 3 in 1 as a seat in the shower. Pt able to return demo tub transfer with min guard assist for safety. Educated pt and daughter on need for supervision with tub transfers upon return home. Pt able to verbally recall 3/3 back precautions and maintain throughout session. D/c plan remains appropriate at this time. Will continue to follow acutely.    Follow Up Recommendations  No OT follow up;Supervision/Assistance - 24 hour    Equipment Recommendations  None recommended by OT    Recommendations for Other Services      Precautions / Restrictions Precautions Precautions: Back;Anterior Hip;Fall Precaution Comments: Pt able to verbally recall 3/3 back precautions and maintain throughout session. Required Braces or Orthoses: Spinal Brace Spinal Brace: Lumbar corset;Applied in sitting position Restrictions Weight Bearing Restrictions: No       Mobility Bed Mobility Overal bed mobility: Needs Assistance Bed Mobility: Rolling;Sidelying to Sit;Sit to Sidelying Rolling: Supervision Sidelying to sit: Supervision     Sit to sidelying: Supervision General bed mobility comments: Supervision for safety, no physical assist required. HOB flat with use of bed rails. Pt with good log roll technique and able to maintain precautions throughout.  Transfers Overall transfer level: Needs assistance Equipment used: Rolling walker (2 wheeled) Transfers: Sit to/from Stand Sit to Stand: Min guard         General transfer comment: Good hand  placement and technique.    Balance Overall balance assessment: Needs assistance Sitting-balance support: Feet supported;No upper extremity supported Sitting balance-Leahy Scale: Good     Standing balance support: During functional activity;Bilateral upper extremity supported Standing balance-Leahy Scale: Good                     ADL Overall ADL's : Needs assistance/impaired                 Upper Body Dressing : Set up;Sitting Upper Body Dressing Details (indicate cue type and reason): for donning/doffing brace             Tub/ Shower Transfer: Min guard;Ambulation;3 in 1;Rolling walker Tub/Shower Transfer Details (indicate cue type and reason): Educated pt on tub transfer technique and use of 3 in 1 in tub as a chair. Pt able to return demo technique with min guard assist for safety. Functional mobility during ADLs: Min guard;Rolling walker General ADL Comments: Pts daughter present at start of session. Educated pt and daughter on need for supervision with tub transfers; pt and daughter verbalize understanding.      Vision                     Perception     Praxis      Cognition   Behavior During Therapy: [facility] for tasks assessed/performed Overall Cognitive Status: Within Functional Limits for tasks assessed                       Extremity/Trunk Assessment               Exercises  Shoulder Instructions       General Comments      Pertinent Vitals/ Pain       Pain Assessment: 0-10 Pain Score: 4  Pain Location: back Pain Descriptors / Indicators: Sore Pain Intervention(s): Monitored during session;Premedicated before session;Repositioned  Home Living                                          Prior Functioning/Environment              Frequency Min 2X/week     Progress Toward Goals  OT Goals(current goals can now be found in the care plan section)  Progress towards OT goals: Progressing  toward goals  Acute Rehab OT Goals Patient Stated Goal: ultimately return to her home (alone) OT Goal Formulation: With patient  Plan Discharge plan remains appropriate    Co-evaluation                 End of Session Equipment Utilized During Treatment: Rolling walker;Back brace   Activity Tolerance Patient tolerated treatment well   Patient Left in bed;with call bell/phone within reach;with bed alarm set   Nurse Communication          Time: [id number] OT Time Calculation (min): 14 min  Charges: OT General Charges $OT Visit: 1 Procedure OT Treatments $Self Care/Home Management : 8-22 mins  Binnie Kand M.S., OTR/L Pager: [phone]  04/14/2015, 4:17 PM

## 2015-04-15 LAB — GLUCOSE, CAPILLARY
GLUCOSE-CAPILLARY: 123 mg/dL — AB (ref 65–99)
GLUCOSE-CAPILLARY: 52 mg/dL — AB (ref 65–99)
GLUCOSE-CAPILLARY: 178 mg/dL — AB (ref 65–99)
GLUCOSE-CAPILLARY: 92 mg/dL (ref 65–99)
Glucose-Capillary: 148 mg/dL — ABNORMAL HIGH (ref 65–99)
Glucose-Capillary: 54 mg/dL — ABNORMAL LOW (ref 65–99)

## 2015-04-15 MED ORDER — OXYCODONE HCL 5 MG PO TABS
15.0000 mg | ORAL_TABLET | ORAL | Status: DC | PRN
  Administered 2015-04-15 – 2015-04-16 (×4): 15 mg via ORAL
  Filled 2015-04-15 (×4): qty 3

## 2015-04-15 NOTE — Care Management Important Message (Signed)
Important Message  Patient Details  Name: Diana Hale MRN: [id number] Date of Birth: 08-28-1931   Medicare Important Message Given:  Yes    Keely Drennan P Marshall 04/15/2015, 2:12 PM

## 2015-04-15 NOTE — Progress Notes (Signed)
Patient ID: Diana Hale, female   DOB: 1931/12/17, 82 y.o.   MRN: [id number] Doing well still a condition of back pain no leg pain  Strength out of 5 wound clean dry and intact  Change medicine Dilaudid to oxycodone remove her Hemovac.

## 2015-04-15 NOTE — Progress Notes (Signed)
Physical Therapy Treatment and Discharge Patient Details Name: Diana Hale MRN: [id number] DOB: 05-24-1931 Today's Date: 04/15/2015    History of Present Illness Adm with back and bil leg pain; s/p removal of hardware L4-5, L2-3 laminectomies and PLIF PMHx- back & neck surgery, Rt THR, DM, urinary incontinence (wears "diapers" per pt), ovarian Ca    PT Comments    Patient progressing well. Adhering to back precautions. All questions answered and pt agrees no further PT needs at this time. Will discharge from PT   Follow Up Recommendations  No PT follow up;Supervision for mobility/OOB     Equipment Recommendations  None recommended by PT    Recommendations for Other Services       Precautions / Restrictions Precautions Precautions: Back;Anterior Hip;Fall (Anterior hip per previous med records) Precaution Comments: Pt able to verbally recall 3/3 back precautions and maintain throughout session. Required Braces or Orthoses: Spinal Brace Spinal Brace: Lumbar corset;Applied in sitting position    Mobility  Bed Mobility Overal bed mobility: Needs Assistance Bed Mobility: Rolling;Sidelying to Sit;Sit to Sidelying Rolling: Modified independent (Device/Increase time) Sidelying to sit: Supervision     Sit to sidelying: Supervision General bed mobility comments: + rail, HOB flat;  Transfers Overall transfer level: Modified independent Equipment used: Rolling walker (2 wheeled) Transfers: Sit to/from Stand Sit to Stand: Modified independent (Device/Increase time)            Ambulation/Gait Ambulation/Gait assistance: Supervision Ambulation Distance (Feet): 200 Feet Assistive device: Rolling walker (2 wheeled) Gait Pattern/deviations: Step-through pattern;Decreased stride length Gait velocity: decreased Gait velocity interpretation: Below normal speed for age/gender General Gait Details: kyphotic posture with ability to mostly reverse with cues; vc for safe use of  RW   Stairs Stairs:  (pt denied need to practice; "I've done this many times")          Wheelchair Mobility    Modified Rankin (Stroke Patients Only)       Balance     Sitting balance-Leahy Scale: Good       Standing balance-Leahy Scale: Good                      Cognition Arousal/Alertness: Awake/alert Behavior During Therapy: WFL for tasks assessed/performed Overall Cognitive Status: Within Functional Limits for tasks assessed                      Exercises      General Comments        Pertinent Vitals/Pain Pain Assessment: 0-10 Pain Score: 3  Pain Location: back Pain Descriptors / Indicators: Operative site guarding Pain Intervention(s): Limited activity within patient's tolerance;Monitored during session;Repositioned;Ice applied    Home Living                      Prior Function            PT Goals (current goals can now be found in the care plan section) Acute Rehab PT Goals Patient Stated Goal: ultimately return to her home (alone) PT Goal Formulation: With patient Time For Goal Achievement: 04/17/15 Potential to Achieve Goals: Good Progress towards PT goals: Goals met/education completed, patient discharged from PT    Frequency       PT Plan Current plan remains appropriate    Co-evaluation             End of Session Equipment Utilized During Treatment: Gait belt;Back brace Activity Tolerance: Patient tolerated treatment well Patient left:  in bed;with call bell/phone within reach     Time: 1457-1515 PT Time Calculation (min) (ACUTE ONLY): 18 min  Charges:  $Gait Training: 8-22 mins                    G Codes:      Sandeep Delagarza 04-27-15, 3:27 PM Pager [phone]

## 2015-04-16 LAB — GLUCOSE, CAPILLARY
GLUCOSE-CAPILLARY: 112 mg/dL — AB (ref 65–99)
GLUCOSE-CAPILLARY: 295 mg/dL — AB (ref 65–99)
GLUCOSE-CAPILLARY: 124 mg/dL — AB (ref 65–99)
Glucose-Capillary: 229 mg/dL — ABNORMAL HIGH (ref 65–99)

## 2015-04-16 MED ORDER — OXYCODONE HCL 5 MG PO TABS
20.0000 mg | ORAL_TABLET | ORAL | Status: DC | PRN
  Administered 2015-04-16 – 2015-04-17 (×9): 20 mg via ORAL
  Filled 2015-04-16 (×9): qty 4

## 2015-04-16 NOTE — Progress Notes (Signed)
Occupational Therapy Treatment Patient Details Name: Diana Hale MRN: [id number] DOB: 08/28/1931 Today's Date: 04/16/2015    History of present illness Adm with back and bil leg pain; s/p removal of hardware L4-5, L2-3 laminectomies and PLIF PMHx- back & neck surgery, Rt THR, DM, urinary incontinence (wears "diapers" per pt), ovarian Ca   OT comments  Pt. Completing functional mobility and ADLS at S.  All education complete clear for D/C from.  Pt. Has no questions or concerns. Alert OTR/L for sign off.    Follow Up Recommendations  No OT follow up;Supervision/Assistance - 24 hour    Equipment Recommendations  None recommended by OT    Recommendations for Other Services      Precautions / Restrictions Precautions Precautions: Back;Anterior Hip;Fall Precaution Comments: Pt able to verbally recall 3/3 back precautions and maintain throughout session. Required Braces or Orthoses: Spinal Brace Spinal Brace: Lumbar corset;Applied in sitting position       Mobility Bed Mobility Overal bed mobility: Modified Independent Bed Mobility: Rolling;Sidelying to Sit Rolling: Modified independent (Device/Increase time) Sidelying to sit: Modified independent (Device/Increase time)       General bed mobility comments: HOB flat, no rail, exits from R side at home, initiates log roll without cues  Transfers Overall transfer level: Needs assistance Equipment used: Rolling walker (2 wheeled) Transfers: Sit to/from [facility] Sit to Stand: Supervision Stand pivot transfers: Supervision       General transfer comment: Good hand placement and technique.    Balance                                   ADL Overall ADL's : Needs assistance/impaired     Grooming: Wash/dry hands;Supervision/safety;Standing         Lower Body Bathing Details (indicate cue type and reason): declined need for review,reports she has all A/E at home from previous sx. and uses  regularly Upper Body Dressing : Set up;Sitting     Lower Body Dressing Details (indicate cue type and reason): declined need for review.  states she has sock-aide and reacher from previous sx. and uses regularly Toilet Transfer: Supervision/safety;Ambulation;RW;BSC;Regular Glass blower/designer Details (indicate cue type and reason): 3n1 over commode Toileting- Clothing Manipulation and Hygiene: Supervision/safety;Sit to/from stand       Functional mobility during ADLs: Supervision/safety;Rolling walker General ADL Comments: able to complete all tasks at S level.  clear for d/c      Vision                     Perception     Praxis      Cognition   Behavior During Therapy: [facility] for tasks assessed/performed Overall Cognitive Status: Within Functional Limits for tasks assessed                       Extremity/Trunk Assessment               Exercises     Shoulder Instructions       General Comments      Pertinent Vitals/ Pain       Pain Assessment:  (did not rate but reports pain managment had been an issue)  Home Living  Prior Functioning/Environment              Frequency Min 2X/week     Progress Toward Goals  OT Goals(current goals can now be found in the care plan section)  Progress towards OT goals: Goals met/education completed, patient discharged from [facility] Discharge plan remains appropriate    Co-evaluation                 End of Session Equipment Utilized During Treatment: Gait belt;Rolling walker;Back brace   Activity Tolerance Patient tolerated treatment well   Patient Left in bed;with call bell/phone within reach;with bed alarm set   Nurse Communication          Time: 6578-4696 OT Time Calculation (min): 13 min  Charges: OT General Charges $OT Visit: 1 Procedure OT Treatments $Self Care/Home Management : 8-22 mins  Janice Coffin, COTA/L 04/16/2015, 10:23 AM

## 2015-04-16 NOTE — Progress Notes (Signed)
Patient ID: Diana Hale, female   DOB: [birth year], 82 y.o.   MRN: [id number] Still condition back pain adjustment medication helped somewhat but still feels the pain medication is not strong enough no leg pain voiding and ambulating  Strength 5 out of 5 wound clean dry and intact  Increase oxygen 20 mg every 3 physical and occupational therapy

## 2015-04-17 LAB — GLUCOSE, CAPILLARY
Glucose-Capillary: 129 mg/dL — ABNORMAL HIGH (ref 65–99)
Glucose-Capillary: 143 mg/dL — ABNORMAL HIGH (ref 65–99)

## 2015-04-17 MED ORDER — CYCLOBENZAPRINE HCL 10 MG PO TABS: 10.0000 mg | ORAL_TABLET | Freq: Two times a day (BID) | ORAL | Status: DC | PRN

## 2015-04-17 MED ORDER — OXYCODONE HCL 20 MG PO TABS: 20.0000 mg | ORAL_TABLET | ORAL | Status: DC | PRN

## 2015-04-17 NOTE — Discharge Instructions (Signed)
Spinal Fusion, Care After °These instructions give you information on caring for yourself after your procedure. Your doctor may also give you more specific instructions. Call your doctor if you have any problems or questions after your procedure. °HOME CARE °· Only take medicines as told by your doctor. °· Do not drive if you are taking certain pain medicines (narcotics). °· Change your bandage (dressing) as told by your doctor. °· Do not get the wound wet. Do not take baths or swim until your doctor says it is okay. °· Check the wound often for redness, puffiness (swelling), or leaking fluids. °· Ask your doctor what activities you should avoid and for how long. °· Walk as much as you can.  Do not sit for long periods of time. Change positions every hour. °· Do not lift anything heavier than 5 to 10 pounds (2.25 to 4.5 kilograms) until your doctor says it is safe. °· Do not twist or bend for 6 weeks. Try not to pull on things. Do not hold things away from your body. °· Ask your doctor what exercises you should do. Exercises can help make the back stronger. °GET HELP RIGHT AWAY IF: °· Your pain suddenly gets worse. °· The wound is red, puffy, bleeding, or leaking fluid. °· Your legs or feet are painful, puffy, weak, or lose feeling (numb). °· You cannot control when you pee (urinate) or poop (bowel movement). °· You have trouble breathing. °· You have chest pain. °· You have a temperature by mouth above 102° F (38.9° C), not controlled by medicine. °MAKE SURE YOU: °· Understand these instructions. °· Will watch your condition. °· Will get help right away if you are not doing well or get worse. °  °This information is not intended to replace advice given to you by your health care provider. Make sure you discuss any questions you have with your health care provider. °  °Document Released: 05/26/2010 Document Revised: 02/20/2014 Document Reviewed: 07/15/2014 °Elsevier Interactive Patient Education ©2016 [employer]  Inc. ° °

## 2015-04-17 NOTE — Discharge Summary (Signed)
  Physician Discharge Summary  Patient ID: Diana Hale MRN: [id number] DOB/AGE: [birth year] 80 y.o.  Admit date: 04/12/2015 Discharge date: 04/17/2015  Admission Diagnoses: Lumbar spinal stenosis  Discharge Diagnoses: Same Active Problems:   Spinal stenosis of lumbar region   Discharged Condition: Stable  Hospital Course:  Mrs. Diana Hale is a 82 y.o. female electively admitted after lumbar fusion. She had a largely unremarkable hospital course with slow but steady improvement in her pain. She was ambulating well and did not require any PT/OT f/u. Pain was controlled with oral medication. She was voiding normally, tolerating diet.  Treatments: Surgery - Lumbar decompression/fusion  Discharge Exam: Blood pressure 135/46, pulse 83, temperature 98.9 F (37.2 C), temperature source Oral, resp. rate 18, height 5\' 5"  (1.651 m), weight 97.523 kg (215 lb), SpO2 96 %. Awake, alert, oriented Speech fluent, appropriate CN grossly intact 5/5 BUE/BLE Wound c/d/i  Disposition: Home     Medication List    TAKE these medications        ADVIL COLD/SINUS PO  Take 1 tablet by mouth 3 (three) times daily. Reports that she takes 2- 3 times a day     ASTEPRO 0.1 % nasal spray  Generic drug:  azelastine  Place 1 spray into the nose 2 (two) times daily. Use in each nostril as directed     B COMPLEX-VITAMIN B12 PO  Take 2,500 mcg by mouth daily.     Biotin 5000 MCG Tabs  Take 1 tablet by mouth daily.     Cholecalciferol 2000 units Caps  Take 1 capsule by mouth daily.     cyclobenzaprine 10 MG tablet  Commonly known as:  FLEXERIL  Take 1 tablet (10 mg total) by mouth 2 (two) times daily as needed for muscle spasms.     FOLIC ACID PO  Take 1 tablet by mouth daily.     insulin aspart protamine- aspart (70-30) 100 UNIT/ML injection  Commonly known as:  NOVOLOG MIX 70/30  Inject 60 Units into the skin 2 (two) times daily with a meal. Sliding scale     levothyroxine 100 MCG  tablet  Commonly known as:  SYNTHROID, LEVOTHROID  Take 100 mcg by mouth daily before breakfast.     Oxycodone HCl 20 MG Tabs  Take 1 tablet (20 mg total) by mouth every 3 (three) hours as needed for moderate pain.     pantoprazole 40 MG tablet  Commonly known as:  PROTONIX  Take 40 mg by mouth 2 (two) times daily.     polyethylene glycol packet  Commonly known as:  MIRALAX / GLYCOLAX  Take 17 g by mouth 2 (two) times daily.     Vitamin B-12 2500 MCG Subl  Place 1 tablet under the tongue daily.           Follow-up Information    Follow up with [facility], [facility].   Specialty:  Neurosurgery   Contact information:   [address] [place] [address] [phone]       Signed: Consuella Lose, Loletha Grayer 04/17/2015, 7:53 AM

## 2015-04-17 NOTE — Progress Notes (Signed)
RN discussed discharge instructions with patient. Patient vocalized understanding of discharge instructions including s/sx of infections, mobility, wound care, pain management. States she has a follow up appointment already scheduled with Dr. Saintclair Halsted that she will follow up. Pt's neuro assessment is unchanged, states pain is controlled at a "3/10" in lower back. Discharge AVS and prescriptions for oxy ir and flexeril given to patient. Patient states she is awaiting transportation by daughter, daughter will transport patient to her home. Will continue to monitor until discharge

## 2015-08-16 ENCOUNTER — Encounter: Payer: Self-pay | Admitting: Emergency Medicine

## 2015-08-16 ENCOUNTER — Inpatient Hospital Stay
Admission: EM | Admit: 2015-08-16 | Discharge: 2015-08-18 | DRG: 310 | Disposition: A | Discharge status: Home / Self Care | Payer: Medicare Other | Attending: Internal Medicine | Admitting: Internal Medicine

## 2015-08-16 ENCOUNTER — Emergency Department: Payer: Medicare Other

## 2015-08-16 DIAGNOSIS — Z9842 Cataract extraction status, left eye: Secondary | ICD-10-CM

## 2015-08-16 DIAGNOSIS — E039 Hypothyroidism, unspecified: Secondary | ICD-10-CM | POA: Diagnosis present

## 2015-08-16 DIAGNOSIS — I214 Non-ST elevation (NSTEMI) myocardial infarction: Secondary | ICD-10-CM

## 2015-08-16 DIAGNOSIS — R609 Edema, unspecified: Secondary | ICD-10-CM | POA: Diagnosis present

## 2015-08-16 DIAGNOSIS — R011 Cardiac murmur, unspecified: Secondary | ICD-10-CM | POA: Diagnosis present

## 2015-08-16 DIAGNOSIS — E119 Type 2 diabetes mellitus without complications: Secondary | ICD-10-CM | POA: Diagnosis present

## 2015-08-16 DIAGNOSIS — Z9049 Acquired absence of other specified parts of digestive tract: Secondary | ICD-10-CM | POA: Diagnosis not present

## 2015-08-16 DIAGNOSIS — R600 Localized edema: Secondary | ICD-10-CM

## 2015-08-16 DIAGNOSIS — Z9841 Cataract extraction status, right eye: Secondary | ICD-10-CM

## 2015-08-16 DIAGNOSIS — I480 Paroxysmal atrial fibrillation: Principal | ICD-10-CM | POA: Diagnosis present

## 2015-08-16 DIAGNOSIS — Z9889 Other specified postprocedural states: Secondary | ICD-10-CM

## 2015-08-16 DIAGNOSIS — M1990 Unspecified osteoarthritis, unspecified site: Secondary | ICD-10-CM | POA: Diagnosis present

## 2015-08-16 DIAGNOSIS — Z794 Long term (current) use of insulin: Secondary | ICD-10-CM

## 2015-08-16 DIAGNOSIS — R079 Chest pain, unspecified: Secondary | ICD-10-CM

## 2015-08-16 DIAGNOSIS — Z8543 Personal history of malignant neoplasm of ovary: Secondary | ICD-10-CM | POA: Diagnosis not present

## 2015-08-16 DIAGNOSIS — I440 Atrioventricular block, first degree: Secondary | ICD-10-CM | POA: Diagnosis present

## 2015-08-16 DIAGNOSIS — Z87891 Personal history of nicotine dependence: Secondary | ICD-10-CM

## 2015-08-16 DIAGNOSIS — Z888 Allergy status to other drugs, medicaments and biological substances status: Secondary | ICD-10-CM

## 2015-08-16 DIAGNOSIS — Z96641 Presence of right artificial hip joint: Secondary | ICD-10-CM | POA: Diagnosis present

## 2015-08-16 DIAGNOSIS — I4891 Unspecified atrial fibrillation: Secondary | ICD-10-CM | POA: Diagnosis present

## 2015-08-16 DIAGNOSIS — K219 Gastro-esophageal reflux disease without esophagitis: Secondary | ICD-10-CM | POA: Diagnosis present

## 2015-08-16 DIAGNOSIS — R0602 Shortness of breath: Secondary | ICD-10-CM

## 2015-08-16 LAB — BASIC METABOLIC PANEL
Anion gap: 10 (ref 5–15)
BUN: 12 mg/dL (ref 6–20)
CO2: 26 mmol/L (ref 22–32)
CREATININE: 0.93 mg/dL (ref 0.44–1.00)
Calcium: 9.2 mg/dL (ref 8.9–10.3)
Chloride: 103 mmol/L (ref 101–111)
GFR calc Af Amer: >60 mL/min (ref >60)
GFR, EST NON AFRICAN AMERICAN: 56 mL/min — AB (ref >60)
GLUCOSE: 170 mg/dL — AB (ref 65–99)
POTASSIUM: 3.9 mmol/L (ref 3.5–5.1)
SODIUM: 139 mmol/L (ref 135–145)

## 2015-08-16 LAB — TSH: TSH: 1.467 u[IU]/mL (ref 0.350–4.500)

## 2015-08-16 LAB — CBC
HEMATOCRIT: 33 % — AB (ref 35.0–47.0)
Hemoglobin: 10.6 g/dL — ABNORMAL LOW (ref 12.0–16.0)
MCH: 25.3 pg — ABNORMAL LOW (ref 26.0–34.0)
MCHC: 32 g/dL (ref 32.0–36.0)
MCV: 79 fL — AB (ref 80.0–100.0)
PLATELETS: 261 10*3/uL (ref 150–440)
RBC: 4.18 MIL/uL (ref 3.80–5.20)
RDW: 18.2 % — AB (ref 11.5–14.5)
WBC: 10.2 10*3/uL (ref 3.6–11.0)

## 2015-08-16 LAB — TROPONIN I
TROPONIN I: 0.23 ng/mL — AB (ref <0.03)
Troponin I: 0.31 ng/mL (ref <0.03)
Troponin I: 0.25 ng/mL (ref <0.03)

## 2015-08-16 LAB — MAGNESIUM: Magnesium: 1.6 mg/dL — ABNORMAL LOW (ref 1.7–2.4)

## 2015-08-16 LAB — GLUCOSE, CAPILLARY
GLUCOSE-CAPILLARY: 108 mg/dL — AB (ref 65–99)
Glucose-Capillary: 128 mg/dL — ABNORMAL HIGH (ref 65–99)

## 2015-08-16 LAB — PHOSPHORUS: PHOSPHORUS: 3.1 mg/dL (ref 2.5–4.6)

## 2015-08-16 LAB — PROTIME-INR
INR: 1
Prothrombin Time: 13.4 seconds (ref 11.4–15.0)

## 2015-08-16 LAB — APTT: APTT: 29 s (ref 24–36)

## 2015-08-16 LAB — T4, FREE: FREE T4: 1.08 ng/dL (ref 0.61–1.12)

## 2015-08-16 LAB — HEPARIN LEVEL (UNFRACTIONATED): HEPARIN UNFRACTIONATED: 0.18 [IU]/mL — AB (ref 0.30–0.70)

## 2015-08-16 LAB — FIBRIN DERIVATIVES D-DIMER (ARMC ONLY): Fibrin derivatives D-dimer (ARMC): 552 — ABNORMAL HIGH (ref 0–499)

## 2015-08-16 MED ORDER — OXYCODONE HCL 5 MG PO TABS
10.0000 mg | ORAL_TABLET | ORAL | Status: DC | PRN
  Administered 2015-08-16: 10 mg via ORAL
  Administered 2015-08-16 – 2015-08-17 (×2): 20 mg via ORAL
  Administered 2015-08-17: 10 mg via ORAL
  Administered 2015-08-17: 20 mg via ORAL
  Administered 2015-08-17 – 2015-08-18 (×5): 10 mg via ORAL
  Filled 2015-08-16: qty 4
  Filled 2015-08-16: qty 2
  Filled 2015-08-16: qty 1
  Filled 2015-08-16 (×4): qty 2
  Filled 2015-08-16: qty 1
  Filled 2015-08-16: qty 4
  Filled 2015-08-16 (×3): qty 2
  Filled 2015-08-16: qty 4

## 2015-08-16 MED ORDER — SODIUM CHLORIDE 0.9% FLUSH: 3.0000 mL | Freq: Two times a day (BID) | INTRAVENOUS | Status: DC

## 2015-08-16 MED ORDER — BIOTIN 5000 MCG PO TABS: 1.0000 | ORAL_TABLET | Freq: Every day | ORAL | Status: DC

## 2015-08-16 MED ORDER — HEPARIN (PORCINE) IN NACL 100-0.45 UNIT/ML-% IJ SOLN
1500.0000 [IU]/h | INTRAMUSCULAR | Status: DC
  Administered 2015-08-16: 1200 [IU]/h via INTRAVENOUS
  Filled 2015-08-16 (×3): qty 250

## 2015-08-16 MED ORDER — SODIUM CHLORIDE 0.9% FLUSH: 3.0000 mL | INTRAVENOUS | Status: DC | PRN

## 2015-08-16 MED ORDER — POLYETHYLENE GLYCOL 3350 17 G PO PACK
17.0000 g | PACK | Freq: Two times a day (BID) | ORAL | Status: DC
  Administered 2015-08-16 – 2015-08-17 (×2): 17 g via ORAL
  Filled 2015-08-16 (×4): qty 1

## 2015-08-16 MED ORDER — LEVOTHYROXINE SODIUM 100 MCG PO TABS
100.0000 ug | ORAL_TABLET | Freq: Every day | ORAL | Status: DC
  Administered 2015-08-17 – 2015-08-18 (×2): 100 ug via ORAL
  Filled 2015-08-16 (×2): qty 1

## 2015-08-16 MED ORDER — INSULIN ASPART 100 UNIT/ML ~~LOC~~ SOLN
0.0000 [IU] | Freq: Three times a day (TID) | SUBCUTANEOUS | Status: DC
  Administered 2015-08-17 (×2): 2 [IU] via SUBCUTANEOUS
  Filled 2015-08-16 (×3): qty 2

## 2015-08-16 MED ORDER — CYCLOBENZAPRINE HCL 10 MG PO TABS: 10.0000 mg | ORAL_TABLET | Freq: Two times a day (BID) | ORAL | Status: DC | PRN

## 2015-08-16 MED ORDER — INSULIN ASPART PROT & ASPART (70-30 MIX) 100 UNIT/ML ~~LOC~~ SUSP
60.0000 [IU] | Freq: Two times a day (BID) | SUBCUTANEOUS | Status: DC
  Administered 2015-08-17: 60 [IU] via SUBCUTANEOUS
  Filled 2015-08-16 (×2): qty 60

## 2015-08-16 MED ORDER — FOLIC ACID 1 MG PO TABS
1.0000 mg | ORAL_TABLET | Freq: Every day | ORAL | Status: DC
  Administered 2015-08-17 – 2015-08-18 (×2): 1 mg via ORAL
  Filled 2015-08-16 (×2): qty 1

## 2015-08-16 MED ORDER — INSULIN ASPART 100 UNIT/ML ~~LOC~~ SOLN
0.0000 [IU] | Freq: Every day | SUBCUTANEOUS | Status: DC
Start: 2015-08-16 — End: 2015-08-18

## 2015-08-16 MED ORDER — METOPROLOL TARTRATE 25 MG PO TABS
25.0000 mg | ORAL_TABLET | Freq: Two times a day (BID) | ORAL | Status: DC
  Administered 2015-08-16 – 2015-08-18 (×5): 25 mg via ORAL
  Filled 2015-08-16 (×6): qty 1

## 2015-08-16 MED ORDER — ASPIRIN EC 325 MG PO TBEC
325.0000 mg | DELAYED_RELEASE_TABLET | Freq: Every day | ORAL | Status: DC
  Administered 2015-08-17 – 2015-08-18 (×2): 325 mg via ORAL
  Filled 2015-08-16 (×2): qty 1

## 2015-08-16 MED ORDER — ASPIRIN 81 MG PO CHEW
324.0000 mg | CHEWABLE_TABLET | Freq: Once | ORAL | Status: AC
  Administered 2015-08-16: 324 mg via ORAL
  Filled 2015-08-16: qty 4

## 2015-08-16 MED ORDER — PANTOPRAZOLE SODIUM 40 MG PO TBEC
40.0000 mg | DELAYED_RELEASE_TABLET | Freq: Two times a day (BID) | ORAL | Status: DC
  Administered 2015-08-16 – 2015-08-18 (×4): 40 mg via ORAL
  Filled 2015-08-16 (×4): qty 1

## 2015-08-16 MED ORDER — SODIUM CHLORIDE 0.9 % IV SOLN: 250.0000 mL | INTRAVENOUS | Status: DC | PRN

## 2015-08-16 MED ORDER — VITAMIN B-12 1000 MCG PO TABS
1000.0000 ug | ORAL_TABLET | Freq: Every day | ORAL | Status: DC
  Administered 2015-08-17 – 2015-08-18 (×2): 1000 ug via ORAL
  Filled 2015-08-16 (×2): qty 1

## 2015-08-16 MED ORDER — HEPARIN BOLUS VIA INFUSION
2400.0000 [IU] | Freq: Once | INTRAVENOUS | Status: AC
  Administered 2015-08-16: 2400 [IU] via INTRAVENOUS
  Filled 2015-08-16: qty 2400

## 2015-08-16 MED ORDER — VITAMIN D 1000 UNITS PO TABS
1000.0000 [IU] | ORAL_TABLET | Freq: Every day | ORAL | Status: DC
  Administered 2015-08-17 – 2015-08-18 (×2): 1000 [IU] via ORAL
  Filled 2015-08-16 (×2): qty 1

## 2015-08-16 MED ORDER — SODIUM CHLORIDE 0.9% FLUSH
3.0000 mL | Freq: Two times a day (BID) | INTRAVENOUS | Status: DC
  Administered 2015-08-17: 3 mL via INTRAVENOUS

## 2015-08-16 MED ORDER — HEPARIN BOLUS VIA INFUSION
4000.0000 [IU] | Freq: Once | INTRAVENOUS | Status: AC
  Administered 2015-08-16: 4000 [IU] via INTRAVENOUS
  Filled 2015-08-16: qty 4000

## 2015-08-16 MED ORDER — OXYCODONE HCL 5 MG PO TABS
20.0000 mg | ORAL_TABLET | ORAL | Status: DC | PRN
  Administered 2015-08-16: 20 mg via ORAL
  Filled 2015-08-16: qty 4

## 2015-08-16 NOTE — ED Notes (Addendum)
Patient to ER for c/o shortness of breath for few days. Patient states she had nerve block to C-spine on 08/04/15 and had tachypnea at that time that resolved on its own. Patient reports having difficulty getting a full breath. Patient reports increased shortness of breath on exertion. Patient able to speak in complete sentences without difficulty, but shortness of breath is noticeable.

## 2015-08-16 NOTE — H&P (Signed)
Diana Hale is an 82 y.o. female.   Chief Complaint: Shortness of breath HPI: Began 2 days ago with shortness of breath when lies down, mild chest discomfort, and feet swelling. Better yesterday. Became worse today and came to ED. Found to be in rapid atrial fib with elevated cardiac enzymes.  Past Medical History  Diagnosis Date  . Diabetes mellitus   . Hypothyroidism   . Urinary frequency   . GERD (gastroesophageal reflux disease)   . Diverticulosis   . Arthritis   . Cancer ([place])     ovarian  . Heart murmur     ASYMPTOMATIC    Past Surgical History  Procedure Laterality Date  . Knee arthroscopy      bilat  . Shoulder arthroscopy      right  . Tonsillectomy    . Appendectomy    . Cholecystectomy    . Hernia repair  1988    hiatal  . Back surgery      1 ceviacl and 2 lumbar   . Cataract extraction Bilateral   . Carpal tunnel release Right   . Colonoscopy    . Esophagogastroduodenoscopy    . Abdominal hysterectomy    . Laparotomy    . Total hip arthroplasty Right 09/03/2014    Procedure: TOTAL HIP ARTHROPLASTY ANTERIOR APPROACH;  Surgeon: Hessie Knows, MD;  Location: [facility] ORS;  Service: Orthopedics;  Laterality: Right;    Family History  Problem Relation Age of Onset  . Tuberculosis Father    Social History:  reports that she has quit smoking. She has never used smokeless tobacco. She reports that she does not drink alcohol or use illicit drugs.  Allergies:  Allergies  Allergen Reactions  . Citalopram Itching  . Demerol [Meperidine] Other (See Comments)    agitation  . Fentanyl   . Lipitor [Atorvastatin] Other (See Comments)    headache  . Losartan Other (See Comments)    headache  . Lovastatin Other (See Comments)    Muscle pain  . Metoclopramide Hcl Other (See Comments)    "made her feel wired"  . Neurontin [Gabapentin]   . Pravastatin Other (See Comments)    Muscle pain  . Trazodone And Nefazodone      (Not in a hospital admission)  Results  for orders placed or performed during the hospital encounter of 08/16/15 (from the past 48 hour(s))  Basic metabolic panel     Status: Abnormal   Collection Time: 08/16/15 12:05 PM  Result Value Ref Range   Sodium 139 135 - 145 mmol/L   Potassium 3.9 3.5 - 5.1 mmol/L   Chloride 103 101 - 111 mmol/L   CO2 26 22 - 32 mmol/L   Glucose, Bld 170 (H) 65 - 99 mg/dL   BUN 12 6 - 20 mg/dL   Creatinine, Ser 0.93 0.44 - 1.00 mg/dL   Calcium 9.2 8.9 - 10.3 mg/dL   GFR calc non Af Amer 56 (L) >60 mL/min   GFR calc Af Amer >60 >60 mL/min    Comment: (NOTE) The eGFR has been calculated using the CKD EPI equation. This calculation has not been validated in all clinical situations. eGFR's persistently <60 mL/min signify possible Chronic Kidney Disease.    Anion gap 10 5 - 15  CBC     Status: Abnormal   Collection Time: 08/16/15 12:05 PM  Result Value Ref Range   WBC 10.2 3.6 - 11.0 K/uL   RBC 4.18 3.80 - 5.20 MIL/uL   Hemoglobin  10.6 (L) 12.0 - 16.0 g/dL   HCT 33.0 (L) 35.0 - 47.0 %   MCV 79.0 (L) 80.0 - 100.0 fL   MCH 25.3 (L) 26.0 - 34.0 pg   MCHC 32.0 32.0 - 36.0 g/dL   RDW 18.2 (H) 11.5 - 14.5 %   Platelets 261 150 - 440 K/uL  Troponin I     Status: Abnormal   Collection Time: 08/16/15 12:05 PM  Result Value Ref Range   Troponin I 0.23 (HH) <0.03 ng/mL    Comment: CRITICAL RESULT CALLED TO, READ BACK BY AND VERIFIED WITH BILL SMITH ON 08/16/15 AT 1256 BY [facility]   Magnesium     Status: Abnormal   Collection Time: 08/16/15 12:05 PM  Result Value Ref Range   Magnesium 1.6 (L) 1.7 - 2.4 mg/dL  T4, free     Status: None   Collection Time: 08/16/15 12:05 PM  Result Value Ref Range   Free T4 1.08 0.61 - 1.12 ng/dL    Comment: (NOTE) Biotin ingestion may interfere with free T4 tests. If the results are inconsistent with the TSH level, previous test results, or the clinical presentation, then consider biotin interference. If needed, order repeat testing after stopping biotin.   TSH      Status: None   Collection Time: 08/16/15 12:05 PM  Result Value Ref Range   TSH 1.467 0.350 - 4.500 uIU/mL  Phosphorus     Status: None   Collection Time: 08/16/15 12:05 PM  Result Value Ref Range   Phosphorus 3.1 2.5 - 4.6 mg/dL  Protime-INR     Status: None   Collection Time: 08/16/15 12:05 PM  Result Value Ref Range   Prothrombin Time 13.4 11.4 - 15.0 seconds   INR 1.00   APTT     Status: None   Collection Time: 08/16/15 12:05 PM  Result Value Ref Range   aPTT 29 24 - 36 seconds   Dg Chest Portable 1 View  08/16/2015  CLINICAL DATA:  Shortness of breath for the past 2 days. EXAM: PORTABLE CHEST 1 VIEW COMPARISON:  Chest x-ray 05/01/2011 FINDINGS: The heart is within normal limits in size given the AP projection. Mild tortuosity of the thoracic aorta. Stable eventration of the right hemidiaphragm. No acute pulmonary findings. IMPRESSION: No acute cardiopulmonary findings. Electronically Signed   By: Marijo Sanes M.D.   On: 08/16/2015 12:59    Review of Systems  Constitutional: Negative for fever and chills.  [name]: Negative for hearing loss.   Eyes: Negative for blurred vision.  Respiratory: Positive for shortness of breath.   Cardiovascular: Positive for chest pain and orthopnea.  Gastrointestinal: Negative for nausea and vomiting.  Genitourinary: Negative for dysuria.  Musculoskeletal: Positive for back pain.  Skin: Negative for rash.  Neurological: Negative for sensory change.  Psychiatric/Behavioral: Negative for depression.    Blood pressure 149/68, pulse 95, temperature 98.4 F (36.9 C), temperature source Oral, resp. rate 22, height 5' 6"  (1.676 m), weight 89.359 kg (197 lb), SpO2 99 %. Physical Exam  Constitutional: She is oriented to person, place, and time. She appears well-developed and well-nourished. No distress.  [name]:  Head: Normocephalic and atraumatic.  Mouth/Throat: Oropharynx is clear and moist. No oropharyngeal exudate.  Eyes: EOM are normal. Pupils are  equal, round, and reactive to light. No scleral icterus.  Neck: Normal range of motion. Neck supple. No JVD present. No tracheal deviation present. No thyromegaly present.  Cardiovascular:  Irregular. 2/6 systolic murmur  Respiratory: Effort normal and  breath sounds normal. No respiratory distress. She exhibits no tenderness.  GI: Soft. Bowel sounds are normal. She exhibits no distension and no mass. There is no tenderness.  Musculoskeletal: Normal range of motion.  Bilateral lower ext pitting edema  Lymphadenopathy:    She has no cervical adenopathy.  Neurological: She is alert and oriented to person, place, and time. No cranial nerve deficit.  Skin: Skin is warm and dry. No rash noted. No erythema.     Assessment/Plan 1. Atrial Fib with RVR: Rate is now lower after 1 dose of cardizem. Still in atrial fib and having symptoms. Will start full dose anticoagulation with heparin. Start B-blocker for rate control since cardiac enzymes elevated. Echo ordered to assess valves. Ordered ddimer, if elevated then consider ruling out PE.  2. Demand Ischemia: Likely secondary to rapid a-fib. On anticoagulation, ASA and B-blocker. Cycling trop and have consulted cardiology.  3. Lower Ext Edema: Suspect right sided CHF. Echo pending. Lungs clear so will not diurese.  4. DM: On home dose insulin plus SSI.  Time spent= 45 min  Baxter Hire, MD 08/16/2015, 1:57 PM

## 2015-08-16 NOTE — ED Provider Notes (Signed)
[facility] Emergency Department Provider Note  ____________________________________________  Time seen: 12:00 PM  I have reviewed the triage vital signs and the nursing notes.   HISTORY  Chief Complaint Shortness of Breath    HPI Diana Hale is a 82 y.o. female complains of shortness of breath gradual onset for last 2 or 3 days. Also associated with that she is having bilateral pitting edema of the lower legs. Denies chest pain or palpitations. No dizziness or syncope. No fevers chills cough or abdominal pain. Never had anything like this before. Shortness of breath is worse supine and with exertion.    Past Medical History  Diagnosis Date  . Diabetes mellitus   . Hypothyroidism   . Urinary frequency   . GERD (gastroesophageal reflux disease)   . Diverticulosis   . Arthritis   . Cancer ([place])     ovarian  . Heart murmur     ASYMPTOMATIC     Patient Active Problem List   Diagnosis Date Noted  . Spinal stenosis of lumbar region 04/12/2015  . Back pain at L4-L5 level 11/13/2014  . DDD (degenerative disc disease), lumbar 10/27/2014  . Lumbar post-laminectomy syndrome 10/27/2014  . Facet syndrome, lumbar 10/27/2014  . Status post total replacement of right hip 10/27/2014  . Sacroiliac joint dysfunction 10/27/2014  . Primary osteoarthritis of right hip 09/03/2014     Past Surgical History  Procedure Laterality Date  . Knee arthroscopy      bilat  . Shoulder arthroscopy      right  . Tonsillectomy    . Appendectomy    . Cholecystectomy    . Hernia repair  1988    hiatal  . Back surgery      1 ceviacl and 2 lumbar   . Cataract extraction Bilateral   . Carpal tunnel release Right   . Colonoscopy    . Esophagogastroduodenoscopy    . Abdominal hysterectomy    . Laparotomy    . Total hip arthroplasty Right 09/03/2014    Procedure: TOTAL HIP ARTHROPLASTY ANTERIOR APPROACH;  Surgeon: Hessie Knows, MD;  Location: [facility] ORS;  Service:  Orthopedics;  Laterality: Right;     Current Outpatient Rx  Name  Route  Sig  Dispense  Refill  . azelastine (ASTEPRO) 137 MCG/SPRAY nasal spray   Nasal   Place 1 spray into the nose 2 (two) times daily. Use in each nostril as directed         . B Complex-Folic Acid (B COMPLEX-VITAMIN B12 PO)   Oral   Take 2,500 mcg by mouth daily.         . Biotin 5000 MCG TABS   Oral   Take 1 tablet by mouth daily.         . Cholecalciferol 2000 UNITS CAPS   Oral   Take 1 capsule by mouth daily.         . Cyanocobalamin (VITAMIN B-12) 2500 MCG SUBL   Sublingual   Place 1 tablet under the tongue daily.         . cyclobenzaprine (FLEXERIL) 10 MG tablet   Oral   Take 1 tablet (10 mg total) by mouth 2 (two) times daily as needed for muscle spasms.   60 tablet   0   . FOLIC ACID PO   Oral   Take 1 tablet by mouth daily.         . insulin aspart protamine-insulin aspart (NOVOLOG 70/30) (70-30) 100 UNIT/ML injection  Subcutaneous   Inject 60 Units into the skin 2 (two) times daily with a meal. Sliding scale         . levothyroxine (SYNTHROID, LEVOTHROID) 100 MCG tablet   Oral   Take 100 mcg by mouth daily before breakfast.         . oxyCODONE 20 MG TABS   Oral   Take 1 tablet (20 mg total) by mouth every 3 (three) hours as needed for moderate pain.   60 tablet   0   . pantoprazole (PROTONIX) 40 MG tablet   Oral   Take 40 mg by mouth 2 (two) times daily.         . polyethylene glycol (MIRALAX / GLYCOLAX) packet   Oral   Take 17 g by mouth 2 (two) times daily.         . Pseudoephedrine-Ibuprofen (ADVIL COLD/SINUS PO)   Oral   Take 1 tablet by mouth 3 (three) times daily. Reports that she takes 2- 3 times a day            Allergies Citalopram; Demerol; Fentanyl; Lipitor; Losartan; Lovastatin; Metoclopramide hcl; Neurontin; Pravastatin; and Trazodone and nefazodone   Family History  Problem Relation Age of Onset  . Tuberculosis Father     Social  History Social History  Substance Use Topics  . Smoking status: Former Research scientist (life sciences)  . Smokeless tobacco: Never Used  . Alcohol Use: No    Review of Systems  Constitutional:   No fever or chills.  ENT:   No sore throat. No rhinorrhea. Cardiovascular:   No chest pain. Respiratory:   Positive shortness of breath without cough. Gastrointestinal:   Negative for abdominal pain, vomiting and diarrhea.  Genitourinary:   Negative for dysuria or difficulty urinating. Musculoskeletal:   Lower extremity edema Neurological:   Negative for headaches 10-point ROS otherwise negative.  ____________________________________________   PHYSICAL EXAM:  VITAL SIGNS: ED Triage Vitals  Enc Vitals Group     BP 08/16/15 1146 149/68 mmHg     Pulse Rate 08/16/15 1146 95     Resp 08/16/15 1146 22     Temp 08/16/15 1146 98.4 F (36.9 C)     Temp Source 08/16/15 1146 Oral     SpO2 08/16/15 1146 99 %     Weight 08/16/15 1146 197 lb (89.359 kg)     Height 08/16/15 1146 5\' 6"  (1.676 m)     Head Cir --      Peak Flow --      Pain Score 08/16/15 1204 0     Pain Loc --      Pain Edu? --      Excl. in [place]? --     Vital signs reviewed, nursing assessments reviewed.   Constitutional:   Alert and oriented. Not in distress. Eyes:   No scleral icterus. No conjunctival pallor. PERRL. EOMI.  No nystagmus. ENT   Head:   Normocephalic and atraumatic.   Nose:   No congestion/rhinnorhea. No septal hematoma   Mouth/Throat:   MMM, no pharyngeal erythema. No peritonsillar mass.    Neck:   No stridor. No SubQ emphysema. No meningismus. Positive JVD when lying supine Hematological/Lymphatic/Immunilogical:   No cervical lymphadenopathy. Cardiovascular:   Irregularly irregular rhythm, rate of about 90. Symmetric bilateral radial and DP pulses.  No murmurs.  Respiratory:   Normal respiratory effort without tachypnea nor retractions. Breath sounds are clear and equal bilaterally. No  wheezes/rales/rhonchi. Gastrointestinal:   Soft and nontender. Non distended. There  is no CVA tenderness.  No rebound, rigidity, or guarding. Genitourinary:   deferred Musculoskeletal:   Nontender with normal range of motion in all extremities. No joint effusions.  No lower extremity tenderness.  2+ pitting edema bilaterally. Neurologic:   Normal speech and language.  CN 2-10 normal. Motor grossly intact. No gross focal neurologic deficits are appreciated.  Skin:    Skin is warm, dry and intact. No rash noted.  No petechiae, purpura, or bullae.  ____________________________________________    LABS (pertinent positives/negatives) (all labs ordered are listed, but only abnormal results are displayed) Labs Reviewed  BASIC METABOLIC PANEL - Abnormal; Notable for the following:    Glucose, Bld 170 (*)    GFR calc non Af Amer 56 (*)    All other components within normal limits  CBC - Abnormal; Notable for the following:    Hemoglobin 10.6 (*)    HCT 33.0 (*)    MCV 79.0 (*)    MCH 25.3 (*)    RDW 18.2 (*)    All other components within normal limits  TROPONIN I - Abnormal; Notable for the following:    Troponin I 0.23 (*)    All other components within normal limits  PROTIME-INR  APTT  MAGNESIUM  T4, FREE  TSH  PHOSPHORUS   ____________________________________________   EKG  Interpreted by me Atrial fibrillation rate of 109, left axis, normal intervals. Right bundle branch block, normal ST segments. T wave inversions in V2 and V3 which are new compared to February 2017.  ____________________________________________    RADIOLOGY  Chest x-ray unremarkable  ____________________________________________   PROCEDURES CRITICAL CARE Performed by: Carrie Mew   Total critical care time: 35 minutes  Critical care time was exclusive of separately billable procedures and treating other patients.  Critical care was necessary to treat or prevent imminent or  life-threatening deterioration.  Critical care was time spent personally by me on the following activities: development of treatment plan with patient and/or surrogate as well as nursing, discussions with consultants, evaluation of patient's response to treatment, examination of patient, obtaining history from patient or surrogate, ordering and performing treatments and interventions, ordering and review of laboratory studies, ordering and review of radiographic studies, pulse oximetry and re-evaluation of patient's condition.   ____________________________________________   INITIAL IMPRESSION / ASSESSMENT AND PLAN / ED COURSE  Pertinent labs & imaging results that were available during my care of the patient were reviewed by me and considered in my medical decision making (see chart for details).  Patient presents with new-onset A. fib associated with exertional shortness of breath and edema. We'll get labs and chest x-ray. Plan to hospitalize for further evaluation and consideration of anticoagulation therapy.   ----------------------------------------- 1:18 PM on 08/16/2015 -----------------------------------------  Troponin elevated at 0.23. With EKG changes and new A. fib, and exertional symptoms, we'll treat as NSTEMI and start heparin. We'll discussed with hospitalist for admission.    ____________________________________________   FINAL CLINICAL IMPRESSION(S) / ED DIAGNOSES  Final diagnoses:  NSTEMI (non-ST elevated myocardial infarction) [facility])  New onset atrial fibrillation ([place])  Peripheral edema       Portions of this note were generated with dragon dictation software. Dictation errors may occur despite best attempts at proofreading.   Carrie Mew, MD 08/16/15 1332

## 2015-08-16 NOTE — Progress Notes (Addendum)
ANTICOAGULATION CONSULT NOTE - Initial Consult  Pharmacy Consult for Heparin Drip Indication: atrial fibrillation  Allergies  Allergen Reactions  . Citalopram Itching  . Demerol [Meperidine] Other (See Comments)    agitation  . Fentanyl   . Lipitor [Atorvastatin] Other (See Comments)    headache  . Losartan Other (See Comments)    headache  . Lovastatin Other (See Comments)    Muscle pain  . Metoclopramide Hcl Other (See Comments)    "made her feel wired"  . Neurontin [Gabapentin]   . Pravastatin Other (See Comments)    Muscle pain  . Trazodone And Nefazodone     Patient Measurements: Height: 5\' 5"  (165.1 cm) Weight: 211 lb 8 oz (95.936 kg) IBW/kg (Calculated) : 57 Heparin Dosing Weight: 78.7 kg  Vital Signs: Temp: 98.5 F (36.9 C) (07/03 1921) Temp Source: Oral (07/03 1921) BP: 147/63 mmHg (07/03 1921) Pulse Rate: 90 (07/03 1921)  Labs:  Recent Labs  08/16/15 1205 08/16/15 1522 08/16/15 2128 08/16/15 2254  HGB 10.6*  --   --   --   HCT 33.0*  --   --   --   PLT 261  --   --   --   APTT 29  --   --   --   LABPROT 13.4  --   --   --   INR 1.00  --   --   --   HEPARINUNFRC  --   --   --  0.18*  CREATININE 0.93  --   --   --   TROPONINI 0.23* 0.31* 0.25*  --     Estimated Creatinine Clearance: 54.4 mL/min (by C-G formula based on Cr of 0.93).   Medical History: Past Medical History  Diagnosis Date  . Diabetes mellitus   . Hypothyroidism   . Urinary frequency   . GERD (gastroesophageal reflux disease)   . Diverticulosis   . Arthritis   . Cancer ([place])     ovarian  . Heart murmur     ASYMPTOMATIC    Medications:  Scheduled:  . [START ON 08/17/2015] aspirin EC  325 mg Oral Daily  . [START ON 08/17/2015] cholecalciferol  1,000 Units Oral Daily  . folic acid  1 mg Oral Daily  . heparin  2,400 Units Intravenous Once  . insulin aspart  0-5 Units Subcutaneous QHS  . insulin aspart  0-9 Units Subcutaneous TID WC  . insulin aspart protamine- aspart  60  Units Subcutaneous BID WC  . [START ON 08/17/2015] levothyroxine  100 mcg Oral QAC breakfast  . metoprolol tartrate  25 mg Oral BID  . pantoprazole  40 mg Oral BID  . polyethylene glycol  17 g Oral BID  . sodium chloride flush  3 mL Intravenous Q12H  . sodium chloride flush  3 mL Intravenous Q12H  . vitamin B-12  1,000 mcg Oral Daily   Infusions:  . heparin 1,200 Units/hr (08/16/15 1429)    Assessment: 82 yo female ordered heparin drip for A-Fib w/RVR.  R/O PE.    Goal of Therapy:  Heparin level 0.3-0.7 units/ml Monitor platelets by anticoagulation protocol   Plan:  Give 4000 units bolus x 1 Start heparin infusion at 1200 units/hr Check anti-Xa level in 8 hours and daily while on heparin Continue to monitor H&H and platelets  HL ordered for 08/16/15 at 22:30.  7/3:  HL @ 22:30 = 0.18 Will order Heparin 2400 units IV X 1 bolus and increase drip rate to 1500 units/hr.  Will recheck HL on 7/4 @ 0730.  [place] Clinical Pharmacist 08/16/2015,11:38 PM

## 2015-08-16 NOTE — Consult Note (Signed)
[facility] Cardiology  CARDIOLOGY CONSULT NOTE  Patient ID: Diana Hale MRN: [id number] DOB/AGE: [birth year] 82 y.o.  Admit date: 08/16/2015 Referring Physician Edwina Barth Primary Physician [facility] Primary Cardiologist  Reason for Consultation Elevated troponin  HPI: 82 year old female referred for evaluation of exertional dyspnea, atrial fibrillation, and elevated troponin. The patient denies chest pain. Approximately 2 days ago the patient experienced exertional dyspnea and shortness of breath at rest. Symptoms appeared to improve one day later only to recur today. She presented [facility] emergency room where ECG revealed atrial fibrillation with a rapid ventricular rate. Admission labs were notable for borderline elevated troponin in the absence of chest pain. The patient was admitted to telemetry where she has spontaneously converted to sinus rhythm. The patient currently is symptom free.  Review of systems complete and found to be negative unless listed above     Past Medical History  Diagnosis Date  . Diabetes mellitus   . Hypothyroidism   . Urinary frequency   . GERD (gastroesophageal reflux disease)   . Diverticulosis   . Arthritis   . Cancer ([place])     ovarian  . Heart murmur     ASYMPTOMATIC    Past Surgical History  Procedure Laterality Date  . Knee arthroscopy      bilat  . Shoulder arthroscopy      right  . Tonsillectomy    . Appendectomy    . Cholecystectomy    . Hernia repair  1988    hiatal  . Back surgery      1 ceviacl and 2 lumbar   . Cataract extraction Bilateral   . Carpal tunnel release Right   . Colonoscopy    . Esophagogastroduodenoscopy    . Abdominal hysterectomy    . Laparotomy    . Total hip arthroplasty Right 09/03/2014    Procedure: TOTAL HIP ARTHROPLASTY ANTERIOR APPROACH;  Surgeon: Hessie Knows, MD;  Location: [facility] ORS;  Service: Orthopedics;  Laterality: Right;    Prescriptions prior to admission  Medication Sig Dispense Refill Last Dose  .  azelastine (ASTEPRO) 137 MCG/SPRAY nasal spray Place 1 spray into the nose 2 (two) times daily. Use in each nostril as directed   08/16/2015 at 0830  . Biotin 1000 MCG tablet Take 1,000 mcg by mouth daily.   08/16/2015 at 0830  . Cholecalciferol 2000 UNITS CAPS Take 1 capsule by mouth daily.   08/16/2015 at 0830  . folic acid (FOLVITE) [id number] MCG tablet Take 800 mcg by mouth daily.   08/16/2015 at 0830  . glucosamine-chondroitin 500-400 MG tablet Take 1 tablet by mouth daily.   08/16/2015 at 0830  . ibuprofen (ADVIL,MOTRIN) 200 MG tablet Take 200 mg by mouth every 6 (six) hours as needed.   prn at prn  . insulin aspart protamine-insulin aspart (NOVOLOG 70/30) (70-30) 100 UNIT/ML injection Inject 60 Units into the skin 2 (two) times daily with a meal. Sliding scale   08/16/2015 at 0830  . levothyroxine (SYNTHROID, LEVOTHROID) 100 MCG tablet Take 100 mcg by mouth daily before breakfast.   08/16/2015 at 0830  . oxyCODONE-acetaminophen (PERCOCET) 10-325 MG tablet Take 1 tablet by mouth every 6 (six) hours as needed for pain.   prn at prn  . pantoprazole (PROTONIX) 40 MG tablet Take 40 mg by mouth 2 (two) times daily.   08/16/2015 at 0830  . Pseudoephedrine-Ibuprofen (ADVIL COLD/SINUS PO) Take 1 tablet by mouth 3 (three) times daily. Reports that she takes 2- 3 times a day   08/16/2015 at  0830  . vitamin B-12 (CYANOCOBALAMIN) 1000 MCG tablet Take 1,000 mcg by mouth daily.   08/16/2015 at [place] History  . Marital Status: Widowed    Spouse Name: N/A  . Number of Children: N/A  . Years of Education: N/A   Occupational History  . Not on file.   Social History Main Topics  . Smoking status: Former Research scientist (life sciences)  . Smokeless tobacco: Never Used  . Alcohol Use: No  . Drug Use: No  . Sexual Activity: No   Other Topics Concern  . Not on file   Social History Narrative    Family History  Problem Relation Age of Onset  . Tuberculosis Father       Review of systems complete and found to be  negative unless listed above      PHYSICAL EXAM  General: Well developed, well nourished, in no acute distress HEENT:  Normocephalic and atramatic Neck:  No JVD.  Lungs: Clear bilaterally to auscultation and percussion. Heart: HRRR . Normal S1 and S2 without gallops or murmurs.  Abdomen: Bowel sounds are positive, abdomen soft and non-tender  Msk:  Back normal, normal gait. Normal strength and tone for age. Extremities: No clubbing, cyanosis or edema.   Neuro: Alert and oriented X 3. Psych:  Good affect, responds appropriately  Labs:   Lab Results  Component Value Date   WBC 10.2 08/16/2015   HGB 10.6* 08/16/2015   HCT 33.0* 08/16/2015   MCV 79.0* 08/16/2015   PLT 261 08/16/2015    Recent Labs Lab 08/16/15 1205  NA 139  K 3.9  CL 103  CO2 26  BUN 12  CREATININE 0.93  CALCIUM 9.2  GLUCOSE 170*   Lab Results  Component Value Date   TROPONINI 0.31* 08/16/2015   No results found for: CHOL No results found for: HDL No results found for: LDLCALC No results found for: TRIG No results found for: CHOLHDL No results found for: LDLDIRECT    Radiology: Dg Chest Portable 1 View  08/16/2015  CLINICAL DATA:  Shortness of breath for the past 2 days. EXAM: PORTABLE CHEST 1 VIEW COMPARISON:  Chest x-ray 05/01/2011 FINDINGS: The heart is within normal limits in size given the AP projection. Mild tortuosity of the thoracic aorta. Stable eventration of the right hemidiaphragm. No acute pulmonary findings. IMPRESSION: No acute cardiopulmonary findings. Electronically Signed   By: Marijo Sanes M.D.   On: 08/16/2015 12:59    EKG: Initial EKG reveals atrial fibrillation with a rapid ventricular rate. Telemetry now reveals sinus rhythm with first-degree AV block  ASSESSMENT AND PLAN:   1. Borderline elevated troponin, minimal peak and trough, in the absence of chest pain, likely due to demand supply ischemia 2. Exertional dyspnea, episodic, likely due to to paroxysmal atrial  fibrillation 3. Paroxysmal atrial fibrillation, chads vasc 4, currently in sinus rhythm with first-degree AV block  Recommendations  1. Agree with overall current therapy 2. Continue to cycle cardiac enzymes 3. Review 2-D echocardiogram 4. Pending patient's initial clinical course, consider Lexiscan Myoview versus cardiac catheterization 5. If Lexiscan Myoview negative, we will start Eliquis 5 mg twice a day for stroke prevention  Signed: Juanangel Soderholm MD,PhD, [facility] 08/16/2015, 4:40 PM

## 2015-08-16 NOTE — Progress Notes (Signed)
Patient admitted today from home alone. Was in Afib in the ER, but patient has been in NSR since being admitted to the floor. Complaining of back pain from surgery that she recently had. Given home medication with improvement. Able to ambulate with no assistance, but instructed to call due to heparin drip and to monitor heart rate. Will continue to monitor.

## 2015-08-16 NOTE — Progress Notes (Signed)
ANTICOAGULATION CONSULT NOTE - Initial Consult  Pharmacy Consult for Heparin Drip Indication: atrial fibrillation  Allergies  Allergen Reactions  . Citalopram Itching  . Demerol [Meperidine] Other (See Comments)    agitation  . Fentanyl   . Lipitor [Atorvastatin] Other (See Comments)    headache  . Losartan Other (See Comments)    headache  . Lovastatin Other (See Comments)    Muscle pain  . Metoclopramide Hcl Other (See Comments)    "made her feel wired"  . Neurontin [Gabapentin]   . Pravastatin Other (See Comments)    Muscle pain  . Trazodone And Nefazodone     Patient Measurements: Height: 5\' 6"  (167.6 cm) Weight: 197 lb (89.359 kg) IBW/kg (Calculated) : 59.3 Heparin Dosing Weight: 78.7 kg  Vital Signs: Temp: 98.1 F (36.7 C) (07/03 1519) Temp Source: Oral (07/03 1519) BP: 183/75 mmHg (07/03 1519) Pulse Rate: 96 (07/03 1519)  Labs:  Recent Labs  08/16/15 1205  HGB 10.6*  HCT 33.0*  PLT 261  APTT 29  LABPROT 13.4  INR 1.00  CREATININE 0.93  TROPONINI 0.23*    Estimated Creatinine Clearance: 53.4 mL/min (by C-G formula based on Cr of 0.93).   Medical History: Past Medical History  Diagnosis Date  . Diabetes mellitus   . Hypothyroidism   . Urinary frequency   . GERD (gastroesophageal reflux disease)   . Diverticulosis   . Arthritis   . Cancer ([place])     ovarian  . Heart murmur     ASYMPTOMATIC    Medications:  Scheduled:  . [START ON 08/17/2015] aspirin EC  325 mg Oral Daily  . [START ON 08/17/2015] cholecalciferol  1,000 Units Oral Daily  . folic acid  1 mg Oral Daily  . insulin aspart  0-5 Units Subcutaneous QHS  . insulin aspart  0-9 Units Subcutaneous TID WC  . insulin aspart protamine- aspart  60 Units Subcutaneous BID WC  . [START ON 08/17/2015] levothyroxine  100 mcg Oral QAC breakfast  . metoprolol tartrate  25 mg Oral BID  . pantoprazole  40 mg Oral BID  . polyethylene glycol  17 g Oral BID  . sodium chloride flush  3 mL Intravenous  Q12H  . sodium chloride flush  3 mL Intravenous Q12H  . vitamin B-12  1,000 mcg Oral Daily   Infusions:  . heparin 1,200 Units/hr (08/16/15 1429)    Assessment: 82 yo female ordered heparin drip for A-Fib w/RVR.  R/O PE.    Goal of Therapy:  Heparin level 0.3-0.7 units/ml Monitor platelets by anticoagulation protocol   Plan:  Give 4000 units bolus x 1 Start heparin infusion at 1200 units/hr Check anti-Xa level in 8 hours and daily while on heparin Continue to monitor H&H and platelets  HL ordered for 08/16/15 at 22:30.  [place] Clinical Pharmacist 08/16/2015,3:20 PM

## 2015-08-17 ENCOUNTER — Inpatient Hospital Stay: Payer: Medicare Other

## 2015-08-17 ENCOUNTER — Inpatient Hospital Stay
Admit: 2015-08-17 | Discharge: 2015-08-17 | Disposition: A | Discharge status: Home / Self Care | Payer: Medicare Other | Attending: Internal Medicine | Admitting: Internal Medicine

## 2015-08-17 LAB — GLUCOSE, CAPILLARY
GLUCOSE-CAPILLARY: 171 mg/dL — AB (ref 65–99)
GLUCOSE-CAPILLARY: 153 mg/dL — AB (ref 65–99)
GLUCOSE-CAPILLARY: 84 mg/dL (ref 65–99)
GLUCOSE-CAPILLARY: 153 mg/dL — AB (ref 65–99)

## 2015-08-17 LAB — HEPARIN LEVEL (UNFRACTIONATED)
HEPARIN UNFRACTIONATED: 0.12 [IU]/mL — AB (ref 0.30–0.70)
Heparin Unfractionated: 0.89 IU/mL — ABNORMAL HIGH (ref 0.30–0.70)

## 2015-08-17 LAB — ECHOCARDIOGRAM COMPLETE
AV Peak grad: 8 mmHg
AV pk vel: 144 cm/s
Ao pk vel: 0.72 m/s
Ao-asc: 31 cm
CHL CUP MV DEC (S): 239
EWDT: 239 ms
FS: 34 % (ref 28–44)
HEIGHTINCHES: 65 in
IVS/LV PW RATIO, ED: 1.38
LA ID, A-P, ES: 41 mm
LA diam end sys: 41 mm
LA vol A4C: 34.1 ml
LADIAMINDEX: 2.02 cm/m2
LVOTPV: 103 cm/s
MV Peak grad: 4 mmHg
MV pk A vel: 97 m/s
MV pk E vel: 99 m/s
Mean grad: 223 mmHg
PW: 12.2 mm — AB (ref 0.6–1.1)
WEIGHTICAEL: 3384 [oz_av]

## 2015-08-17 LAB — TROPONIN I: Troponin I: 0.26 ng/mL (ref <0.03)

## 2015-08-17 MED ORDER — HEPARIN (PORCINE) IN NACL 100-0.45 UNIT/ML-% IJ SOLN
1500.0000 [IU]/h | INTRAMUSCULAR | Status: DC
  Administered 2015-08-17: 1600 [IU]/h via INTRAVENOUS
  Administered 2015-08-18: 1500 [IU]/h via INTRAVENOUS
  Filled 2015-08-17 (×2): qty 250

## 2015-08-17 MED ORDER — NITROGLYCERIN 2 % TD OINT
0.5000 [in_us] | TOPICAL_OINTMENT | Freq: Once | TRANSDERMAL | Status: AC
  Administered 2015-08-17: 0.5 [in_us] via TOPICAL
  Filled 2015-08-17: qty 1

## 2015-08-17 MED ORDER — IOPAMIDOL (ISOVUE-370) INJECTION 76%
100.0000 mL | Freq: Once | INTRAVENOUS | Status: AC | PRN
  Administered 2015-08-17: 100 mL via INTRAVENOUS

## 2015-08-17 MED ORDER — HEPARIN BOLUS VIA INFUSION
2300.0000 [IU] | Freq: Once | INTRAVENOUS | Status: AC
  Administered 2015-08-17: 2300 [IU] via INTRAVENOUS
  Filled 2015-08-17: qty 2300

## 2015-08-17 MED ORDER — HEPARIN (PORCINE) IN NACL 100-0.45 UNIT/ML-% IJ SOLN
1800.0000 [IU]/h | INTRAMUSCULAR | Status: DC
  Filled 2015-08-17: qty 250

## 2015-08-17 MED ORDER — MORPHINE SULFATE (PF) 2 MG/ML IV SOLN
2.0000 mg | Freq: Once | INTRAVENOUS | Status: AC
  Administered 2015-08-17: 2 mg via INTRAVENOUS
  Filled 2015-08-17: qty 1

## 2015-08-17 MED ORDER — ACETAMINOPHEN 325 MG PO TABS
650.0000 mg | ORAL_TABLET | Freq: Four times a day (QID) | ORAL | Status: DC | PRN
  Administered 2015-08-18: 650 mg via ORAL
  Filled 2015-08-17: qty 2

## 2015-08-17 NOTE — Progress Notes (Signed)
[facility] [place] NAME: Diana Hale    MR#:  [id number]  DATE OF BIRTH:  10/21/1931  SUBJECTIVE:  CHIEF COMPLAINT:   Chief Complaint  Patient presents with  . Shortness of Breath   Came  with complaint of chest pain. Found to have A. fib and slight bleeding with troponin. Also had elevated d-dimer.   Currently on heparin IV drip.   Again had episode of shortness of breath in the morning, CT scan of the chest for pulmonary embolism is negative.  REVIEW OF SYSTEMS:  CONSTITUTIONAL: No fever, fatigue or weakness.  EYES: No blurred or double vision.  EARS, NOSE, AND THROAT: No tinnitus or ear pain.  RESPIRATORY: No cough, shortness of breath, wheezing or hemoptysis.  CARDIOVASCULAR: Positive for chest pain, no orthopnea, edema.  GASTROINTESTINAL: No nausea, vomiting, diarrhea or abdominal pain.  GENITOURINARY: No dysuria, hematuria.  ENDOCRINE: No polyuria, nocturia,  HEMATOLOGY: No anemia, easy bruising or bleeding SKIN: No rash or lesion. MUSCULOSKELETAL: No joint pain or arthritis.   NEUROLOGIC: No tingling, numbness, weakness.  PSYCHIATRY: No anxiety or depression.   ROS  DRUG ALLERGIES:   Allergies  Allergen Reactions  . Citalopram Itching  . Demerol [Meperidine] Other (See Comments)    agitation  . Fentanyl   . Lipitor [Atorvastatin] Other (See Comments)    headache  . Losartan Other (See Comments)    headache  . Lovastatin Other (See Comments)    Muscle pain  . Metoclopramide Hcl Other (See Comments)    "made her feel wired"  . Neurontin [Gabapentin]   . Pravastatin Other (See Comments)    Muscle pain  . Trazodone And Nefazodone     VITALS:  Blood pressure 112/52, pulse 65, temperature 97.8 F (36.6 C), temperature source Oral, resp. rate 19, height 5\' 5"  (1.651 m), weight 95.936 kg (211 lb 8 oz), SpO2 97 %.  PHYSICAL EXAMINATION:  GENERAL:  82 y.o.-year-old patient lying in the bed with no acute distress.   EYES: Pupils equal, round, reactive to light and accommodation. No scleral icterus. Extraocular muscles intact.  HEENT: Head atraumatic, normocephalic. Oropharynx and nasopharynx clear.  NECK:  Supple, no jugular venous distention. No thyroid enlargement, no tenderness.  LUNGS: Normal breath sounds bilaterally, no wheezing, rales,rhonchi or crepitation. No use of accessory muscles of respiration.  CARDIOVASCULAR: S1, S2 normal. No murmurs, rubs, or gallops.  ABDOMEN: Soft, nontender, nondistended. Bowel sounds present. No organomegaly or mass.  EXTREMITIES: No pedal edema, cyanosis, or clubbing.  NEUROLOGIC: Cranial nerves II through XII are intact. Muscle strength 5/5 in all extremities. Sensation intact. Gait not checked.  PSYCHIATRIC: The patient is alert and oriented x 3.  SKIN: No obvious rash, lesion, or ulcer.   Physical Exam LABORATORY PANEL:   CBC  Recent Labs Lab 08/16/15 1205  WBC 10.2  HGB 10.6*  HCT 33.0*  PLT 261   ------------------------------------------------------------------------------------------------------------------  Chemistries   Recent Labs Lab 08/16/15 1205  NA 139  K 3.9  CL 103  CO2 26  GLUCOSE 170*  BUN 12  CREATININE 0.93  CALCIUM 9.2  MG 1.6*   ------------------------------------------------------------------------------------------------------------------  Cardiac Enzymes  Recent Labs Lab 08/16/15 2128 08/17/15 0344  TROPONINI 0.25* 0.26*   ------------------------------------------------------------------------------------------------------------------  RADIOLOGY:  Ct Angio Chest Pe W Or Wo Contrast  08/17/2015  CLINICAL DATA:  Intermittent shortness of breath EXAM: CT ANGIOGRAPHY CHEST WITH CONTRAST TECHNIQUE: Multidetector CT imaging of the chest was performed using the standard protocol during bolus  administration of intravenous contrast. Multiplanar CT image reconstructions and MIPs were obtained to evaluate the vascular  anatomy. CONTRAST:  100 cc of Isovue 370 COMPARISON:  None. FINDINGS: Mediastinum/Lymph Nodes: The heart size is mildly enlarged. Aortic atherosclerosis noted. Calcification involving the LAD and left circumflex coronary artery is identified. The trachea is patent and appears midline. Normal appearance of the esophagus. Prominent mediastinal lymph nodes are identified but no adenopathy is present. The main pulmonary artery is patent. No lobar or segmental pulmonary artery filling defects identified. Lungs/Pleura: There is no pleural fluid identified. Peripheral interstitial reticulation is identified within both lungs and appears lower lobe predominant. Calcified granuloma is identified within the right upper lobe. Traction bronchiectasis is present. For example, image number 46 of series 6. No frank honeycombing identified. Upper abdomen: Diffuse hepatic steatosis is identified. Relative hypertrophy the caudate lobe of liver noted. No focal liver abnormality. The adrenal glands appear normal. Visualized portions of the spleen are unremarkable. Musculoskeletal: Spondylosis is identified within the thoracic spine. No aggressive lytic or sclerotic bone lesions. Review of the MIP images confirms the above findings. IMPRESSION: 1. No evidence for acute pulmonary embolus. 2. Chronic interstitial changes including peripheral, subpleural interstitial reticulation and traction bronchiectasis. Findings are suggestive of nonspecific interstitial pneumonitis versus early usual interstitial pneumonitis. Recommend followup high-resolution CT of the chest in 6 months to assess for temporal change. 3. Aortic atherosclerosis and coronary artery calcifications 4. Hepatic steatosis and changes which may be suggestive of early cirrhosis. Electronically Signed   By: Kerby Moors M.D.   On: 08/17/2015 11:45   Dg Chest Port 1 View  08/17/2015  CLINICAL DATA:  Acute onset of shortness of breath. Initial encounter. EXAM: PORTABLE CHEST  1 VIEW COMPARISON:  Chest radiograph performed 08/16/2015 FINDINGS: There is elevation of the right hemidiaphragm. Mild left basilar atelectasis is noted. There is no evidence of pleural effusion or pneumothorax. The cardiomediastinal silhouette is borderline normal in size. No acute osseous abnormalities are seen. IMPRESSION: Elevation of the right hemidiaphragm. Mild left basilar atelectasis noted. Electronically Signed   By: Garald Balding M.D.   On: 08/17/2015 06:28   Dg Chest Portable 1 View  08/16/2015  CLINICAL DATA:  Shortness of breath for the past 2 days. EXAM: PORTABLE CHEST 1 VIEW COMPARISON:  Chest x-ray 05/01/2011 FINDINGS: The heart is within normal limits in size given the AP projection. Mild tortuosity of the thoracic aorta. Stable eventration of the right hemidiaphragm. No acute pulmonary findings. IMPRESSION: No acute cardiopulmonary findings. Electronically Signed   By: Marijo Sanes M.D.   On: 08/16/2015 12:59    ASSESSMENT AND PLAN:   Active Problems:   Atrial fibrillation (HCC)  * Atrial fibrillation with RVR   Responded to Cardizem, now oral metoprolol.   IV heparin drip.   Radiation the control.   Appreciated cardiology consult, stress test tomorrow.  * Elevated cardiac enzymes.   Likely demand ischemia, stress test is scheduled tomorrow.  * Elevated d-dimer   CT scan of the pulmonary angiogram is negative.    * Hypothyroidism   TSH is normal, continue supplement.  * Diabetes    continue insulin aspart 60 mg twice a day and sliding scale coverage.  All the records are reviewed and case discussed with Care Management/Social Workerr. Management plans discussed with the patient, family and they are in agreement.  CODE STATUS: Full code   TOTAL TIME TAKING CARE OF THIS PATIENT: 35 miinutes.   POSSIBLE D/C IN 1-2 DAYS, DEPENDING ON CLINICAL  CONDITION.   Vaughan Basta M.D on 08/17/2015   Between 7am to 6pm - Pager - [phone]  After 6pm go to  [email or web address] - password [email or web address] [facility]  Office  [phone]  CC: Primary care physician; Adin Hector, MD  Note: This dictation was prepared with Dragon dictation along with smaller phrase technology. Any transcriptional errors that result from this process are unintentional.

## 2015-08-17 NOTE — Progress Notes (Signed)
Patient complaining of shortness of breath and feeling like she wasn't getting enough air. 02 saturation level 98 percent. No changes in lung sounds. Md notified. Medication ordered and given. No improvement. MD notified. Respiratory therapist called for assessment. Chest x-ray ordered and completed. Patient moved to chair. Pain medication for back given. Patient feels mildly better in chair. Will continue to monitor.

## 2015-08-17 NOTE — Care Management (Signed)
Attempted assessment. Patient not in room. Left coupon for 30 free rial of Eliquis.

## 2015-08-17 NOTE — Progress Notes (Signed)
ANTICOAGULATION CONSULT NOTE - Follow Up Consult  Pharmacy Consult for Heparin Drip Indication: atrial fibrillation  Allergies  Allergen Reactions  . Citalopram Itching  . Demerol [Meperidine] Other (See Comments)    agitation  . Fentanyl   . Lipitor [Atorvastatin] Other (See Comments)    headache  . Losartan Other (See Comments)    headache  . Lovastatin Other (See Comments)    Muscle pain  . Metoclopramide Hcl Other (See Comments)    "made her feel wired"  . Neurontin [Gabapentin]   . Pravastatin Other (See Comments)    Muscle pain  . Trazodone And Nefazodone     Patient Measurements: Height: 5\' 5"  (165.1 cm) Weight: 211 lb 8 oz (95.936 kg) IBW/kg (Calculated) : 57 Heparin Dosing Weight: 78.7 kg  Vital Signs: Temp: 98.1 F (36.7 C) (07/04 0406) Temp Source: Oral (07/04 0406) BP: 136/82 mmHg (07/04 0406) Pulse Rate: 69 (07/04 0406)  Labs:  Recent Labs  08/16/15 1205 08/16/15 1522 08/16/15 2128 08/16/15 2254 08/17/15 0344 08/17/15 0737  HGB 10.6*  --   --   --   --   --   HCT 33.0*  --   --   --   --   --   PLT 261  --   --   --   --   --   APTT 29  --   --   --   --   --   LABPROT 13.4  --   --   --   --   --   INR 1.00  --   --   --   --   --   HEPARINUNFRC  --   --   --  0.18*  --  0.12*  CREATININE 0.93  --   --   --   --   --   TROPONINI 0.23* 0.31* 0.25*  --  0.26*  --     Estimated Creatinine Clearance: 54.4 mL/min (by C-G formula based on Cr of 0.93).   Medical History: Past Medical History  Diagnosis Date  . Diabetes mellitus   . Hypothyroidism   . Urinary frequency   . GERD (gastroesophageal reflux disease)   . Diverticulosis   . Arthritis   . Cancer ([place])     ovarian  . Heart murmur     ASYMPTOMATIC    Medications:  Scheduled:  . aspirin EC  325 mg Oral Daily  . cholecalciferol  1,000 Units Oral Daily  . folic acid  1 mg Oral Daily  . insulin aspart  0-5 Units Subcutaneous QHS  . insulin aspart  0-9 Units Subcutaneous TID  WC  . insulin aspart protamine- aspart  60 Units Subcutaneous BID WC  . levothyroxine  100 mcg Oral QAC breakfast  . metoprolol tartrate  25 mg Oral BID  . pantoprazole  40 mg Oral BID  . polyethylene glycol  17 g Oral BID  . sodium chloride flush  3 mL Intravenous Q12H  . sodium chloride flush  3 mL Intravenous Q12H  . vitamin B-12  1,000 mcg Oral Daily   Infusions:  . heparin 1,500 Units/hr (08/16/15 2342)    Assessment: 82 yo female ordered heparin drip for ACS/STEMI.    Goal of Therapy:  Heparin level 0.3-0.7 units/ml Monitor platelets by anticoagulation protocol   Plan:  7/4 at 07:37 HL = 0.12.  Ordered Heparin 2300 units IV bolus and increased drip rate to 1800 units/hr.  Will recheck HL on 7/4 @  18:30.  Olivia Canter, RPh Clinical Pharmacist 08/17/2015,8:14 AM

## 2015-08-17 NOTE — Progress Notes (Signed)
[facility] Cardiology  SUBJECTIVE: I have some shortness of breath last night   Filed Vitals:   08/16/15 1431 08/16/15 1519 08/16/15 1921 08/17/15 0406  BP: 139/57 183/75 147/63 136/82  Pulse: 93 96 90 69  Temp:  98.1 F (36.7 C) 98.5 F (36.9 C) 98.1 F (36.7 C)  TempSrc:  Oral Oral Oral  Resp: 21 14 15 18   Height:  5\' 5"  (1.651 m)    Weight:  95.936 kg (211 lb 8 oz)    SpO2: 98% 97% 98% 97%     Intake/Output Summary (Last 24 hours) at 08/17/15 1013 Last data filed at 08/17/15 [id number]  Gross per 24 hour  Intake    120 ml  Output    400 ml  Net   -280 ml      PHYSICAL EXAM  General: Well developed, well nourished, in no acute distress HEENT:  Normocephalic and atramatic Neck:  No JVD.  Lungs: Clear bilaterally to auscultation and percussion. Heart: HRRR . Normal S1 and S2 without gallops or murmurs.  Abdomen: Bowel sounds are positive, abdomen soft and non-tender  Msk:  Back normal, normal gait. Normal strength and tone for age. Extremities: No clubbing, cyanosis or edema.   Neuro: Alert and oriented X 3. Psych:  Good affect, responds appropriately   LABS: Basic Metabolic Panel:  Recent Labs  08/16/15 1205  NA 139  K 3.9  CL 103  CO2 26  GLUCOSE 170*  BUN 12  CREATININE 0.93  CALCIUM 9.2  MG 1.6*  PHOS 3.1   Liver Function Tests: No results for input(s): AST, ALT, ALKPHOS, BILITOT, PROT, ALBUMIN in the last 72 hours. No results for input(s): LIPASE, AMYLASE in the last 72 hours. CBC:  Recent Labs  08/16/15 1205  WBC 10.2  HGB 10.6*  HCT 33.0*  MCV 79.0*  PLT 261   Cardiac Enzymes:  Recent Labs  08/16/15 1522 08/16/15 2128 08/17/15 0344  TROPONINI 0.31* 0.25* 0.26*   BNP: Invalid input(s): POCBNP D-Dimer: No results for input(s): DDIMER in the last 72 hours. Hemoglobin A1C: No results for input(s): HGBA1C in the last 72 hours. Fasting Lipid Panel: No results for input(s): CHOL, HDL, LDLCALC, TRIG, CHOLHDL, LDLDIRECT in the last 72  hours. Thyroid Function Tests:  Recent Labs  08/16/15 1205  TSH 1.467   Anemia Panel: No results for input(s): VITAMINB12, FOLATE, FERRITIN, TIBC, IRON, RETICCTPCT in the last 72 hours.  Dg Chest Port 1 View  08/17/2015  CLINICAL DATA:  Acute onset of shortness of breath. Initial encounter. EXAM: PORTABLE CHEST 1 VIEW COMPARISON:  Chest radiograph performed 08/16/2015 FINDINGS: There is elevation of the right hemidiaphragm. Mild left basilar atelectasis is noted. There is no evidence of pleural effusion or pneumothorax. The cardiomediastinal silhouette is borderline normal in size. No acute osseous abnormalities are seen. IMPRESSION: Elevation of the right hemidiaphragm. Mild left basilar atelectasis noted. Electronically Signed   By: Garald Balding M.D.   On: 08/17/2015 06:28   Dg Chest Portable 1 View  08/16/2015  CLINICAL DATA:  Shortness of breath for the past 2 days. EXAM: PORTABLE CHEST 1 VIEW COMPARISON:  Chest x-ray 05/01/2011 FINDINGS: The heart is within normal limits in size given the AP projection. Mild tortuosity of the thoracic aorta. Stable eventration of the right hemidiaphragm. No acute pulmonary findings. IMPRESSION: No acute cardiopulmonary findings. Electronically Signed   By: Marijo Sanes M.D.   On: 08/16/2015 12:59     Echo pending  TELEMETRY: Sinus rhythm with first degree  AV block:  ASSESSMENT AND PLAN:  Active Problems:   Atrial fibrillation ([place])    1. Borderline elevated troponin, without peak or trough, without chest pain, likely due to demand supply ischemia 2. Episodic shortness of breath, likely due to paroxysmal atrial fibrillation, consider PE 3. Paroxysmal atrial fibrillation, chads Vasc 4, currently in sinus rhythm with first-degree AV block  Recommendations  1. Agree with current therapy 2. Review 2-D echocardiogram 3. Await chest CT to rule out PE 4. If chest CT negative, proceed with Lexiscan sestamibi study in a.m. 5. If Lexiscan sestamibi  study negative, DC home on Eliquis 5 mg twice a day   Jerilee Space, MD, PhD, [facility] 08/17/2015 10:13 AM

## 2015-08-17 NOTE — Care Management (Addendum)
Presents from home where she lives alone. 2 day history of shortness of breath when lying down, chest pain, and feet swelling. A fib with RVR. Heparin gtt. Cardiology consult. Troponin peaked at 0.31. STEMI. To r/o PE.  Attempted to speak with patient. She is resting and would not answer to her name called.

## 2015-08-17 NOTE — Progress Notes (Signed)
Alert and oriented. Has rested comfortably today. No complaints of shortness of breath. Given pain medication one time and patient has been okay and not requested anymore. Heparin drip continued. Patient will have [place] tomorrow, education completed and consent signed and placed in chart. Will continue to monitor.

## 2015-08-17 NOTE — Progress Notes (Signed)
*  PRELIMINARY RESULTS* °Echocardiogram °2D Echocardiogram has been performed. ° °Diana Hale °08/17/2015, 9:24 AM °

## 2015-08-17 NOTE — Progress Notes (Signed)
ANTICOAGULATION CONSULT NOTE - Follow Up Consult  Pharmacy Consult for Heparin Drip Indication: atrial fibrillation  Allergies  Allergen Reactions  . Citalopram Itching  . Demerol [Meperidine] Other (See Comments)    agitation  . Fentanyl   . Lipitor [Atorvastatin] Other (See Comments)    headache  . Losartan Other (See Comments)    headache  . Lovastatin Other (See Comments)    Muscle pain  . Metoclopramide Hcl Other (See Comments)    "made her feel wired"  . Neurontin [Gabapentin]   . Pravastatin Other (See Comments)    Muscle pain  . Trazodone And Nefazodone     Patient Measurements: Height: 5\' 5"  (165.1 cm) Weight: 211 lb 8 oz (95.936 kg) IBW/kg (Calculated) : 57 Heparin Dosing Weight: 78.7 kg  Vital Signs: Temp: 97.8 F (36.6 C) (07/04 1141) BP: 112/52 mmHg (07/04 1141) Pulse Rate: 65 (07/04 1141)  Labs:  Recent Labs  08/16/15 1205 08/16/15 1522 08/16/15 2128 08/16/15 2254 08/17/15 0344 08/17/15 0737 08/17/15 1637  HGB 10.6*  --   --   --   --   --   --   HCT 33.0*  --   --   --   --   --   --   PLT 261  --   --   --   --   --   --   APTT 29  --   --   --   --   --   --   LABPROT 13.4  --   --   --   --   --   --   INR 1.00  --   --   --   --   --   --   HEPARINUNFRC  --   --   --  0.18*  --  0.12* 0.89*  CREATININE 0.93  --   --   --   --   --   --   TROPONINI 0.23* 0.31* 0.25*  --  0.26*  --   --     Estimated Creatinine Clearance: 54.4 mL/min (by C-G formula based on Cr of 0.93).   Medical History: Past Medical History  Diagnosis Date  . Diabetes mellitus   . Hypothyroidism   . Urinary frequency   . GERD (gastroesophageal reflux disease)   . Diverticulosis   . Arthritis   . Cancer ([place])     ovarian  . Heart murmur     ASYMPTOMATIC    Medications:  Scheduled:  . aspirin EC  325 mg Oral Daily  . cholecalciferol  1,000 Units Oral Daily  . folic acid  1 mg Oral Daily  . insulin aspart  0-5 Units Subcutaneous QHS  . insulin aspart   0-9 Units Subcutaneous TID WC  . insulin aspart protamine- aspart  60 Units Subcutaneous BID WC  . levothyroxine  100 mcg Oral QAC breakfast  . metoprolol tartrate  25 mg Oral BID  . pantoprazole  40 mg Oral BID  . polyethylene glycol  17 g Oral BID  . sodium chloride flush  3 mL Intravenous Q12H  . sodium chloride flush  3 mL Intravenous Q12H  . vitamin B-12  1,000 mcg Oral Daily   Infusions:  . heparin      Assessment: 82 yo female ordered heparin drip for ACS/STEMI.    Goal of Therapy:  Heparin level 0.3-0.7 units/ml Monitor platelets by anticoagulation protocol   Plan:  7/4 at 07:37 HL = 0.12.  Ordered Heparin 2300 units IV bolus and increased drip rate to 1800 units/hr.  7/4 heparin level @ 1637 = 0.89 (supratherapeutic) on infusion rate of 1800 units/hr. Spoke with RN who confirmed patient was not exhibiting and signs/symptoms of bleeding. Decrease infusion rate to 1600 units/hr and order HL in 8 hours.  CBC ordered with AM labs tomorrow  Lenis Noon, PharmD Clinical Pharmacist 08/17/2015,5:59 PM

## 2015-08-18 ENCOUNTER — Inpatient Hospital Stay: Payer: Medicare Other

## 2015-08-18 LAB — NM MYOCAR MULTI W/SPECT W/WALL MOTION / EF
CHL CUP NUCLEAR SRS: 5
CHL CUP RESTING HR STRESS: 64 {beats}/min
CSEPED: 1 min
CSEPEDS: 0 s
CSEPPHR: 85 {beats}/min
Estimated workload: 1 METS
LV dias vol: 153 mL (ref 46–106)
LVSYSVOL: 73 mL
MPHR: 139 {beats}/min
NUC STRESS TID: 0.89
Percent HR: 61 %
SDS: 4
SSS: 8

## 2015-08-18 LAB — CBC
HEMATOCRIT: 28.1 % — AB (ref 35.0–47.0)
Hemoglobin: 9.2 g/dL — ABNORMAL LOW (ref 12.0–16.0)
MCH: 25.6 pg — AB (ref 26.0–34.0)
MCHC: 32.6 g/dL (ref 32.0–36.0)
MCV: 78.5 fL — AB (ref 80.0–100.0)
PLATELETS: 223 10*3/uL (ref 150–440)
RBC: 3.58 MIL/uL — AB (ref 3.80–5.20)
RDW: 18 % — ABNORMAL HIGH (ref 11.5–14.5)
WBC: 9.8 10*3/uL (ref 3.6–11.0)

## 2015-08-18 LAB — GLUCOSE, CAPILLARY
GLUCOSE-CAPILLARY: 179 mg/dL — AB (ref 65–99)
Glucose-Capillary: 151 mg/dL — ABNORMAL HIGH (ref 65–99)

## 2015-08-18 LAB — HEPARIN LEVEL (UNFRACTIONATED)
HEPARIN UNFRACTIONATED: 0.49 [IU]/mL (ref 0.30–0.70)
Heparin Unfractionated: 0.78 IU/mL — ABNORMAL HIGH (ref 0.30–0.70)

## 2015-08-18 MED ORDER — CYANOCOBALAMIN 1000 MCG PO TABS: 1000.0000 ug | ORAL_TABLET | Freq: Every day | ORAL | Status: AC

## 2015-08-18 MED ORDER — POLYETHYLENE GLYCOL 3350 17 G PO PACK: 17.0000 g | PACK | Freq: Every day | ORAL | Status: AC | PRN

## 2015-08-18 MED ORDER — REGADENOSON 0.4 MG/5ML IV SOLN
0.4000 mg | Freq: Once | INTRAVENOUS | Status: AC
  Administered 2015-08-18: 0.4 mg via INTRAVENOUS

## 2015-08-18 MED ORDER — APIXABAN 5 MG PO TABS: 5.0000 mg | ORAL_TABLET | Freq: Two times a day (BID) | ORAL | Status: AC

## 2015-08-18 MED ORDER — TECHNETIUM TC 99M TETROFOSMIN IV KIT
13.8700 | PACK | Freq: Once | INTRAVENOUS | Status: AC
  Administered 2015-08-18: 13.87 via INTRAVENOUS

## 2015-08-18 MED ORDER — TECHNETIUM TC 99M TETROFOSMIN IV KIT
32.0400 | PACK | Freq: Once | INTRAVENOUS | Status: AC | PRN
  Administered 2015-08-18: 32.04 via INTRAVENOUS

## 2015-08-18 MED ORDER — METOPROLOL TARTRATE 25 MG PO TABS: 25.0000 mg | ORAL_TABLET | Freq: Two times a day (BID) | ORAL | Status: AC

## 2015-08-18 NOTE — Progress Notes (Signed)
Pt family member called for update, password provided, connection was very poor but RN did as best as possible communicating plan of care and update. Another family member, Theream called, password provided and also updated with plan of care and pt condition.

## 2015-08-18 NOTE — Progress Notes (Signed)
ANTICOAGULATION CONSULT NOTE - Follow Up Consult  Pharmacy Consult for Heparin Drip Indication: atrial fibrillation  Allergies  Allergen Reactions  . Citalopram Itching  . Demerol [Meperidine] Other (See Comments)    agitation  . Fentanyl   . Lipitor [Atorvastatin] Other (See Comments)    headache  . Losartan Other (See Comments)    headache  . Lovastatin Other (See Comments)    Muscle pain  . Metoclopramide Hcl Other (See Comments)    "made her feel wired"  . Neurontin [Gabapentin]   . Pravastatin Other (See Comments)    Muscle pain  . Trazodone And Nefazodone     Patient Measurements: Height: 5\' 5"  (165.1 cm) Weight: 211 lb 8 oz (95.936 kg) IBW/kg (Calculated) : 57 Heparin Dosing Weight: 78.7 kg  Vital Signs: Temp: 99.1 F (37.3 C) (07/04 1959) Temp Source: Oral (07/04 1959) BP: 133/51 mmHg (07/04 1959) Pulse Rate: 65 (07/04 1959)  Labs:  Recent Labs  08/16/15 1205 08/16/15 1522 08/16/15 2128  08/17/15 0344 08/17/15 0737 08/17/15 1637 08/18/15 0234  HGB 10.6*  --   --   --   --   --   --  9.2*  HCT 33.0*  --   --   --   --   --   --  28.1*  PLT 261  --   --   --   --   --   --  223  APTT 29  --   --   --   --   --   --   --   LABPROT 13.4  --   --   --   --   --   --   --   INR 1.00  --   --   --   --   --   --   --   HEPARINUNFRC  --   --   --   < >  --  0.12* 0.89* 0.78*  CREATININE 0.93  --   --   --   --   --   --   --   TROPONINI 0.23* 0.31* 0.25*  --  0.26*  --   --   --   < > = values in this interval not displayed.  Estimated Creatinine Clearance: 54.4 mL/min (by C-G formula based on Cr of 0.93).   Medical History: Past Medical History  Diagnosis Date  . Diabetes mellitus   . Hypothyroidism   . Urinary frequency   . GERD (gastroesophageal reflux disease)   . Diverticulosis   . Arthritis   . Cancer ([place])     ovarian  . Heart murmur     ASYMPTOMATIC    Medications:  Scheduled:  . aspirin EC  325 mg Oral Daily  . cholecalciferol   1,000 Units Oral Daily  . folic acid  1 mg Oral Daily  . insulin aspart  0-5 Units Subcutaneous QHS  . insulin aspart  0-9 Units Subcutaneous TID WC  . insulin aspart protamine- aspart  60 Units Subcutaneous BID WC  . levothyroxine  100 mcg Oral QAC breakfast  . metoprolol tartrate  25 mg Oral BID  . pantoprazole  40 mg Oral BID  . polyethylene glycol  17 g Oral BID  . sodium chloride flush  3 mL Intravenous Q12H  . sodium chloride flush  3 mL Intravenous Q12H  . vitamin B-12  1,000 mcg Oral Daily   Infusions:  . heparin 1,600 Units/hr (08/17/15 1802)  Assessment: 82 yo female ordered heparin drip for ACS/STEMI.    Goal of Therapy:  Heparin level 0.3-0.7 units/ml Monitor platelets by anticoagulation protocol   Plan:  7/4 at 07:37 HL = 0.12.  Ordered Heparin 2300 units IV bolus and increased drip rate to 1800 units/hr.  7/4 heparin level @ 1637 = 0.89 (supratherapeutic) on infusion rate of 1800 units/hr. Spoke with RN who confirmed patient was not exhibiting and signs/symptoms of bleeding. Decrease infusion rate to 1600 units/hr and order HL in 8 hours.  CBC ordered with AM labs tomorrow  7/5 HL @ 2:00 = 0.78  Will decrease drip rate to 1500 units/hr and recheck HL 8 hrs after rate change.   Orene Desanctis, PharmD Clinical Pharmacist 08/18/2015,3:49 AM

## 2015-08-18 NOTE — Care Management (Signed)
Negative for pulmonary embolus.  negative stress test.  to discharge home today.  Confirms she has the Eliquis coupon provided.  Independent in all adls, denies issues accessing medical care, obtaining medications or with transportation.  Current with her PCP.  No discharge needs identified at present by care manager or members of care team

## 2015-08-18 NOTE — Discharge Summary (Signed)
[facility] NAME: Diana Hale    MR#:  [id number]  DATE OF BIRTH:  [birth year]  DATE OF ADMISSION:  08/16/2015 ADMITTING PHYSICIAN: Harrel Lemon, MD  DATE OF DISCHARGE: 08/18/2015  PRIMARY CARE PHYSICIAN: Tama High III, MD    ADMISSION DIAGNOSIS:  Peripheral edema [R60.9] New onset atrial fibrillation ([place]) [I48.91] NSTEMI (non-ST elevated myocardial infarction) ([place]) [I21.4]  DISCHARGE DIAGNOSIS:  Active Problems:   Atrial fibrillation ([place])   SECONDARY DIAGNOSIS:   Past Medical History  Diagnosis Date  . Diabetes mellitus   . Hypothyroidism   . Urinary frequency   . GERD (gastroesophageal reflux disease)   . Diverticulosis   . Arthritis   . Cancer ([place])     ovarian  . Heart murmur     ASYMPTOMATIC    HOSPITAL COURSE:   * Atrial fibrillation with RVR  Responded to Cardizem, now oral metoprolol.  IV heparin drip.  Appreciated cardiology consult, stress test done - which is low risk studies.   D/c home. With oral metoprolol and Eliquis.  * Elevated cardiac enzymes.  Likely demand ischemia, stress test is low risk  * Elevated d-dimer  CT scan of the pulmonary angiogram is negative.   * Hypothyroidism  TSH is normal, continue supplement.  * Diabetes   continue insulin aspart 60 mg twice a day and sliding scale coverage.  DISCHARGE CONDITIONS:   Stable.  CONSULTS OBTAINED:  Treatment Team:  Isaias Cowman, MD  DRUG ALLERGIES:   Allergies  Allergen Reactions  . Citalopram Itching  . Demerol [Meperidine] Other (See Comments)    agitation  . Fentanyl   . Lipitor [Atorvastatin] Other (See Comments)    headache  . Losartan Other (See Comments)    headache  . Lovastatin Other (See Comments)    Muscle pain  . Metoclopramide Hcl Other (See Comments)    "made her feel wired"  . Neurontin [Gabapentin]   . Pravastatin Other (See Comments)    Muscle pain  . Trazodone And  Nefazodone     DISCHARGE MEDICATIONS:   Current Discharge Medication List    START taking these medications   Details  apixaban (ELIQUIS) 5 MG TABS tablet Take 1 tablet (5 mg total) by mouth 2 (two) times daily. Qty: 60 tablet, Refills: 0    metoprolol tartrate (LOPRESSOR) 25 MG tablet Take 1 tablet (25 mg total) by mouth 2 (two) times daily. Qty: 60 tablet, Refills: 0      CONTINUE these medications which have CHANGED   Details  polyethylene glycol (MIRALAX / GLYCOLAX) packet Take 17 g by mouth daily as needed. Qty: 14 each, Refills: 0    vitamin B-12 1000 MCG tablet Take 1 tablet (1,000 mcg total) by mouth daily. Qty: 30 tablet, Refills: 0      CONTINUE these medications which have NOT CHANGED   Details  azelastine (ASTEPRO) 137 MCG/SPRAY nasal spray Place 1 spray into the nose 2 (two) times daily. Use in each nostril as directed    Biotin 1000 MCG tablet Take 1,000 mcg by mouth daily.    Cholecalciferol 2000 UNITS CAPS Take 1 capsule by mouth daily.    folic acid (FOLVITE) Q000111Q MCG tablet Take 800 mcg by mouth daily.    glucosamine-chondroitin 500-400 MG tablet Take 1 tablet by mouth daily.    ibuprofen (ADVIL,MOTRIN) 200 MG tablet Take 200 mg by mouth every 6 (six) hours as needed.    insulin aspart protamine-insulin  aspart (NOVOLOG 70/30) (70-30) 100 UNIT/ML injection Inject 60 Units into the skin 2 (two) times daily with a meal. Sliding scale    levothyroxine (SYNTHROID, LEVOTHROID) 100 MCG tablet Take 100 mcg by mouth daily before breakfast.    oxyCODONE-acetaminophen (PERCOCET) 10-325 MG tablet Take 1 tablet by mouth every 6 (six) hours as needed for pain.    pantoprazole (PROTONIX) 40 MG tablet Take 40 mg by mouth 2 (two) times daily.    Pseudoephedrine-Ibuprofen (ADVIL COLD/SINUS PO) Take 1 tablet by mouth 3 (three) times daily. Reports that she takes 2- 3 times a day      STOP taking these medications     Cyanocobalamin (VITAMIN B-12) 2500 MCG SUBL       cyclobenzaprine (FLEXERIL) 10 MG tablet      oxyCODONE 20 MG TABS          DISCHARGE INSTRUCTIONS:    Follow with cardiology in 2 weeks.  If you experience worsening of your admission symptoms, develop shortness of breath, life threatening emergency, suicidal or homicidal thoughts you must seek medical attention immediately by calling 911 or calling your MD immediately  if symptoms less severe.  You Must read complete instructions/literature along with all the possible adverse reactions/side effects for all the Medicines you take and that have been prescribed to you. Take any new Medicines after you have completely understood and accept all the possible adverse reactions/side effects.   Please note  You were cared for by a hospitalist during your hospital stay. If you have any questions about your discharge medications or the care you received while you were in the hospital after you are discharged, you can call the unit and asked to speak with the hospitalist on call if the hospitalist that took care of you is not available. Once you are discharged, your primary care physician will handle any further medical issues. Please note that NO REFILLS for any discharge medications will be authorized once you are discharged, as it is imperative that you return to your primary care physician (or establish a relationship with a primary care physician if you do not have one) for your aftercare needs so that they can reassess your need for medications and monitor your lab values.    Today   CHIEF COMPLAINT:   Chief Complaint  Patient presents with  . Shortness of Breath    HISTORY OF PRESENT ILLNESS:  Diana Hale  is a 82 y.o. female Began 2 days ago with shortness of breath when lies down, mild chest discomfort, and feet swelling. Better yesterday. Became worse today and came to ED. Found to be in rapid atrial fib with elevated cardiac enzymes.   VITAL SIGNS:  Blood pressure 135/59, pulse  64, temperature 98.3 F (36.8 C), temperature source Oral, resp. rate 14, height 5\' 5"  (1.651 m), weight 95.936 kg (211 lb 8 oz), SpO2 93 %.  I/O:   Intake/Output Summary (Last 24 hours) at 08/18/15 1427 Last data filed at 08/18/15 1420  Gross per 24 hour  Intake    240 ml  Output    950 ml  Net   -710 ml    PHYSICAL EXAMINATION:   GENERAL: 82 y.o.-year-old patient lying in the bed with no acute distress.  EYES: Pupils equal, round, reactive to light and accommodation. No scleral icterus. Extraocular muscles intact.  HEENT: Head atraumatic, normocephalic. Oropharynx and nasopharynx clear.  NECK: Supple, no jugular venous distention. No thyroid enlargement, no tenderness.  LUNGS: Normal breath sounds bilaterally,  no wheezing, rales,rhonchi or crepitation. No use of accessory muscles of respiration.  CARDIOVASCULAR: S1, S2 normal. No murmurs, rubs, or gallops.  ABDOMEN: Soft, nontender, nondistended. Bowel sounds present. No organomegaly or mass.  EXTREMITIES: No pedal edema, cyanosis, or clubbing.  NEUROLOGIC: Cranial nerves II through XII are intact. Muscle strength 5/5 in all extremities. Sensation intact. Gait not checked.  PSYCHIATRIC: The patient is alert and oriented x 3.  SKIN: No obvious rash, lesion, or ulcer.   DATA REVIEW:   CBC  Recent Labs Lab 08/18/15 0234  WBC 9.8  HGB 9.2*  HCT 28.1*  PLT 223    Chemistries   Recent Labs Lab 08/16/15 1205  NA 139  K 3.9  CL 103  CO2 26  GLUCOSE 170*  BUN 12  CREATININE 0.93  CALCIUM 9.2  MG 1.6*    Cardiac Enzymes  Recent Labs Lab 08/17/15 0344  TROPONINI 0.26*    Microbiology Results  Results for orders placed or performed during the hospital encounter of 04/07/15  Surgical pcr screen     Status: None   Collection Time: 04/07/15  2:54 PM  Result Value Ref Range Status   MRSA, PCR NEGATIVE NEGATIVE Final   Staphylococcus aureus NEGATIVE NEGATIVE Final    Comment:        The Xpert SA  Assay (FDA approved for NASAL specimens in patients over 60 years of age), is one component of a comprehensive surveillance program.  Test performance has been validated by [facility] for patients greater than or equal to 61 year old. It is not intended to diagnose infection nor to guide or monitor treatment.     RADIOLOGY:  Ct Angio Chest Pe W Or Wo Contrast  08/17/2015  CLINICAL DATA:  Intermittent shortness of breath EXAM: CT ANGIOGRAPHY CHEST WITH CONTRAST TECHNIQUE: Multidetector CT imaging of the chest was performed using the standard protocol during bolus administration of intravenous contrast. Multiplanar CT image reconstructions and MIPs were obtained to evaluate the vascular anatomy. CONTRAST:  100 cc of Isovue 370 COMPARISON:  None. FINDINGS: Mediastinum/Lymph Nodes: The heart size is mildly enlarged. Aortic atherosclerosis noted. Calcification involving the LAD and left circumflex coronary artery is identified. The trachea is patent and appears midline. Normal appearance of the esophagus. Prominent mediastinal lymph nodes are identified but no adenopathy is present. The main pulmonary artery is patent. No lobar or segmental pulmonary artery filling defects identified. Lungs/Pleura: There is no pleural fluid identified. Peripheral interstitial reticulation is identified within both lungs and appears lower lobe predominant. Calcified granuloma is identified within the right upper lobe. Traction bronchiectasis is present. For example, image number 46 of series 6. No frank honeycombing identified. Upper abdomen: Diffuse hepatic steatosis is identified. Relative hypertrophy the caudate lobe of liver noted. No focal liver abnormality. The adrenal glands appear normal. Visualized portions of the spleen are unremarkable. Musculoskeletal: Spondylosis is identified within the thoracic spine. No aggressive lytic or sclerotic bone lesions. Review of the MIP images confirms the above findings.  IMPRESSION: 1. No evidence for acute pulmonary embolus. 2. Chronic interstitial changes including peripheral, subpleural interstitial reticulation and traction bronchiectasis. Findings are suggestive of nonspecific interstitial pneumonitis versus early usual interstitial pneumonitis. Recommend followup high-resolution CT of the chest in 6 months to assess for temporal change. 3. Aortic atherosclerosis and coronary artery calcifications 4. Hepatic steatosis and changes which may be suggestive of early cirrhosis. Electronically Signed   By: Kerby Moors M.D.   On: 08/17/2015 11:45   Nm Myocar Multi  W/spect W/wall Motion / Ef  08/18/2015   There was no ST segment deviation noted during stress.  Defect 1: There is a small defect of moderate severity present in the apex location.  Findings consistent with prior myocardial infarction.  This is a low risk study.  The left ventricular ejection fraction is mildly decreased (45-54%).    Dg Chest Port 1 View  08/17/2015  CLINICAL DATA:  Acute onset of shortness of breath. Initial encounter. EXAM: PORTABLE CHEST 1 VIEW COMPARISON:  Chest radiograph performed 08/16/2015 FINDINGS: There is elevation of the right hemidiaphragm. Mild left basilar atelectasis is noted. There is no evidence of pleural effusion or pneumothorax. The cardiomediastinal silhouette is borderline normal in size. No acute osseous abnormalities are seen. IMPRESSION: Elevation of the right hemidiaphragm. Mild left basilar atelectasis noted. Electronically Signed   By: Garald Balding M.D.   On: 08/17/2015 06:28    EKG:   Orders placed or performed during the hospital encounter of 08/16/15  . ED EKG  . ED EKG  . EKG 12-Lead  . EKG 12-Lead      Management plans discussed with the patient, family and they are in agreement.  CODE STATUS:     Code Status Orders        Start     Ordered   08/16/15 1508  Full code   Continuous     08/16/15 1507    Code Status History    Date  Active Date Inactive Code Status Order ID Comments User Context   09/03/2014  2:47 PM 09/06/2014  2:35 PM Full Code [id number]  Hessie Knows, MD Inpatient      TOTAL TIME TAKING CARE OF THIS PATIENT: 35 minutes.    Vaughan Basta M.D on 08/18/2015 at 2:27 PM  Between 7am to 6pm - Pager - [phone]  After 6pm go to [email or web address] - password [email or web address] [facility]  Office  [phone]  CC: Primary care physician; Adin Hector, MD   Note: This dictation was prepared with Dragon dictation along with smaller phrase technology. Any transcriptional errors that result from this process are unintentional.

## 2015-08-18 NOTE — Care Management Important Message (Signed)
Important Message  Patient Details  Name: DORCUS ALEJANDRE MRN: [id number] Date of Birth: 1931/05/15   Medicare Important Message Given:  Yes    Juliann Pulse A Janean Eischen 08/18/2015, 11:18 AM

## 2015-08-18 NOTE — Progress Notes (Signed)
Pt. Discharged to home via wc. Discharge instructions and medication regimen reviewed at bedside with patient. Pt. verbalizes understanding of instructions and medication regimen. Prescriptions included with d/c papers. Patient assessment unchanged from this morning. TELE and IV discontinued per policy.   

## 2015-09-06 ENCOUNTER — Other Ambulatory Visit
Admission: RE | Admit: 2015-09-06 | Discharge: 2015-09-06 | Disposition: A | Discharge status: Home / Self Care | Payer: Medicare Other | Source: Ambulatory Visit | Attending: Physician Assistant | Admitting: Physician Assistant

## 2015-09-06 DIAGNOSIS — I480 Paroxysmal atrial fibrillation: Secondary | ICD-10-CM | POA: Diagnosis not present

## 2015-09-06 DIAGNOSIS — R609 Edema, unspecified: Secondary | ICD-10-CM | POA: Diagnosis present

## 2015-09-06 DIAGNOSIS — D649 Anemia, unspecified: Secondary | ICD-10-CM | POA: Insufficient documentation

## 2015-09-06 DIAGNOSIS — R0602 Shortness of breath: Secondary | ICD-10-CM | POA: Insufficient documentation

## 2015-09-06 LAB — BRAIN NATRIURETIC PEPTIDE: B NATRIURETIC PEPTIDE 5: 1308 pg/mL — AB (ref 0.0–100.0)

## 2016-06-25 IMAGING — CR DG HIP (WITH OR WITHOUT PELVIS) 2-3V*R*
1 series · 2 of 2 positions shown · non-contrast
Comparison: None.

CLINICAL DATA: Post right total hip replacement

EXAM:
DG HIP (WITH OR WITHOUT PELVIS) 2-3V RIGHT

[Series 1: ap · 0.17mm/px · 2 of 2 slices shown]
[im 1/2]
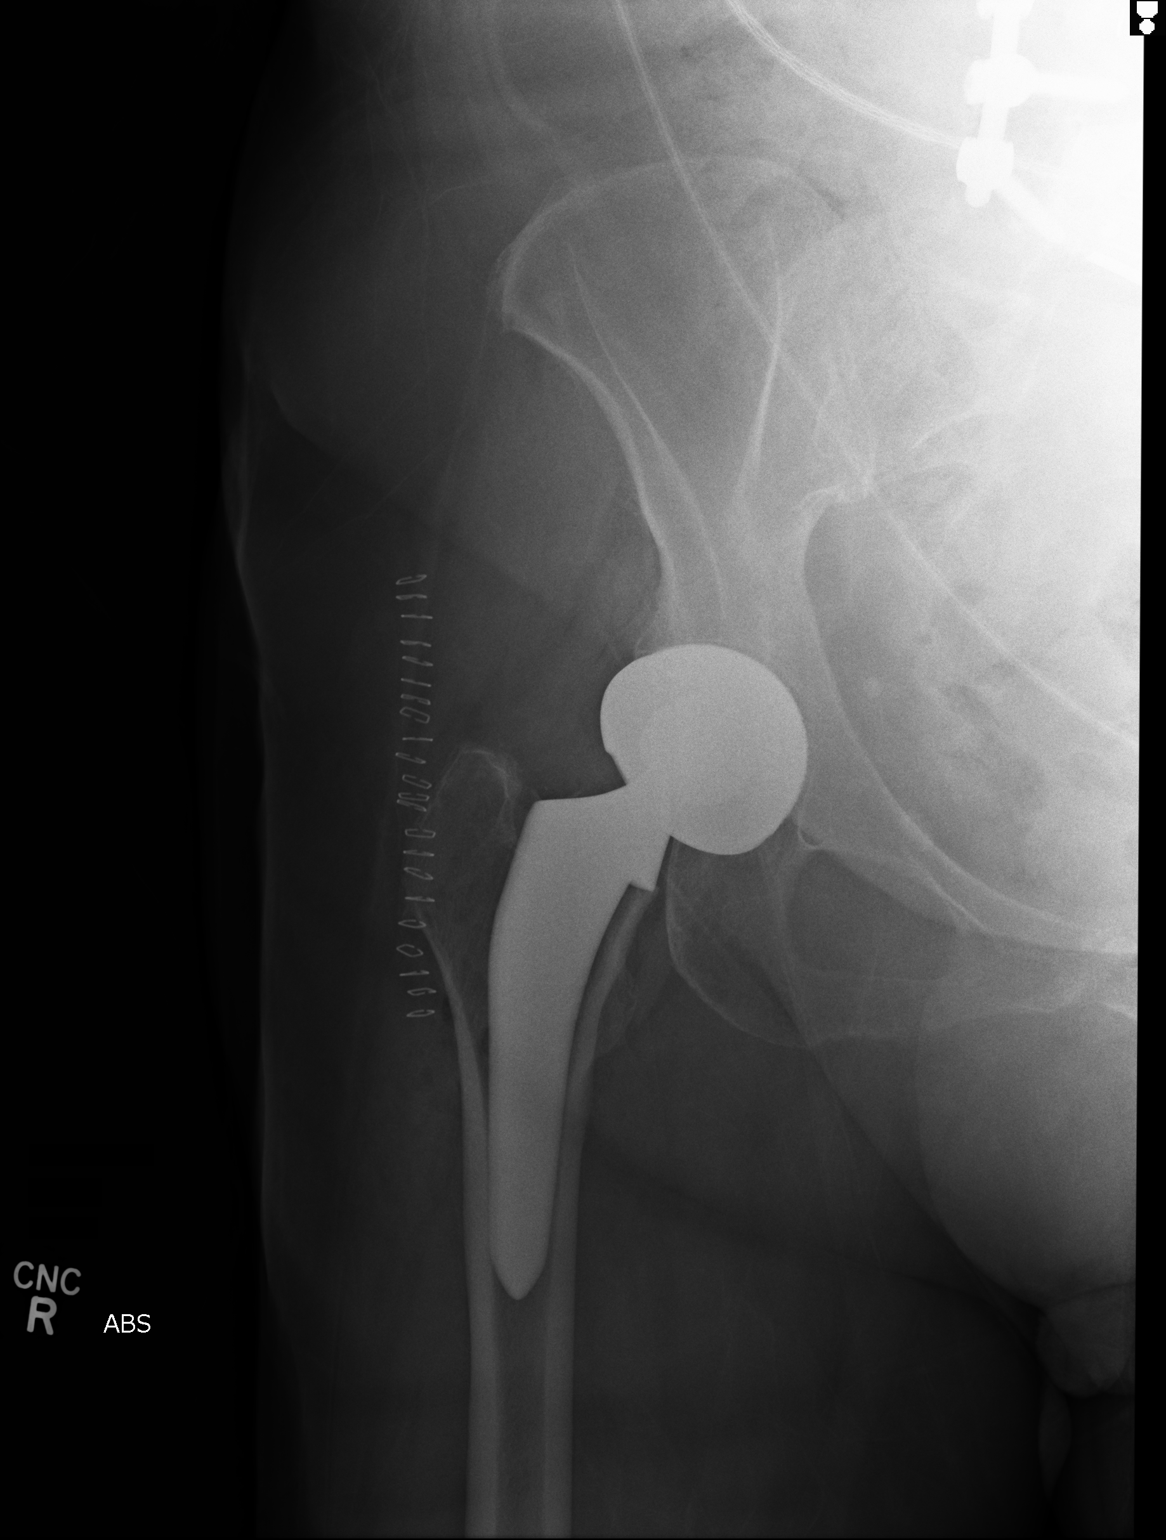
[im 2/2]
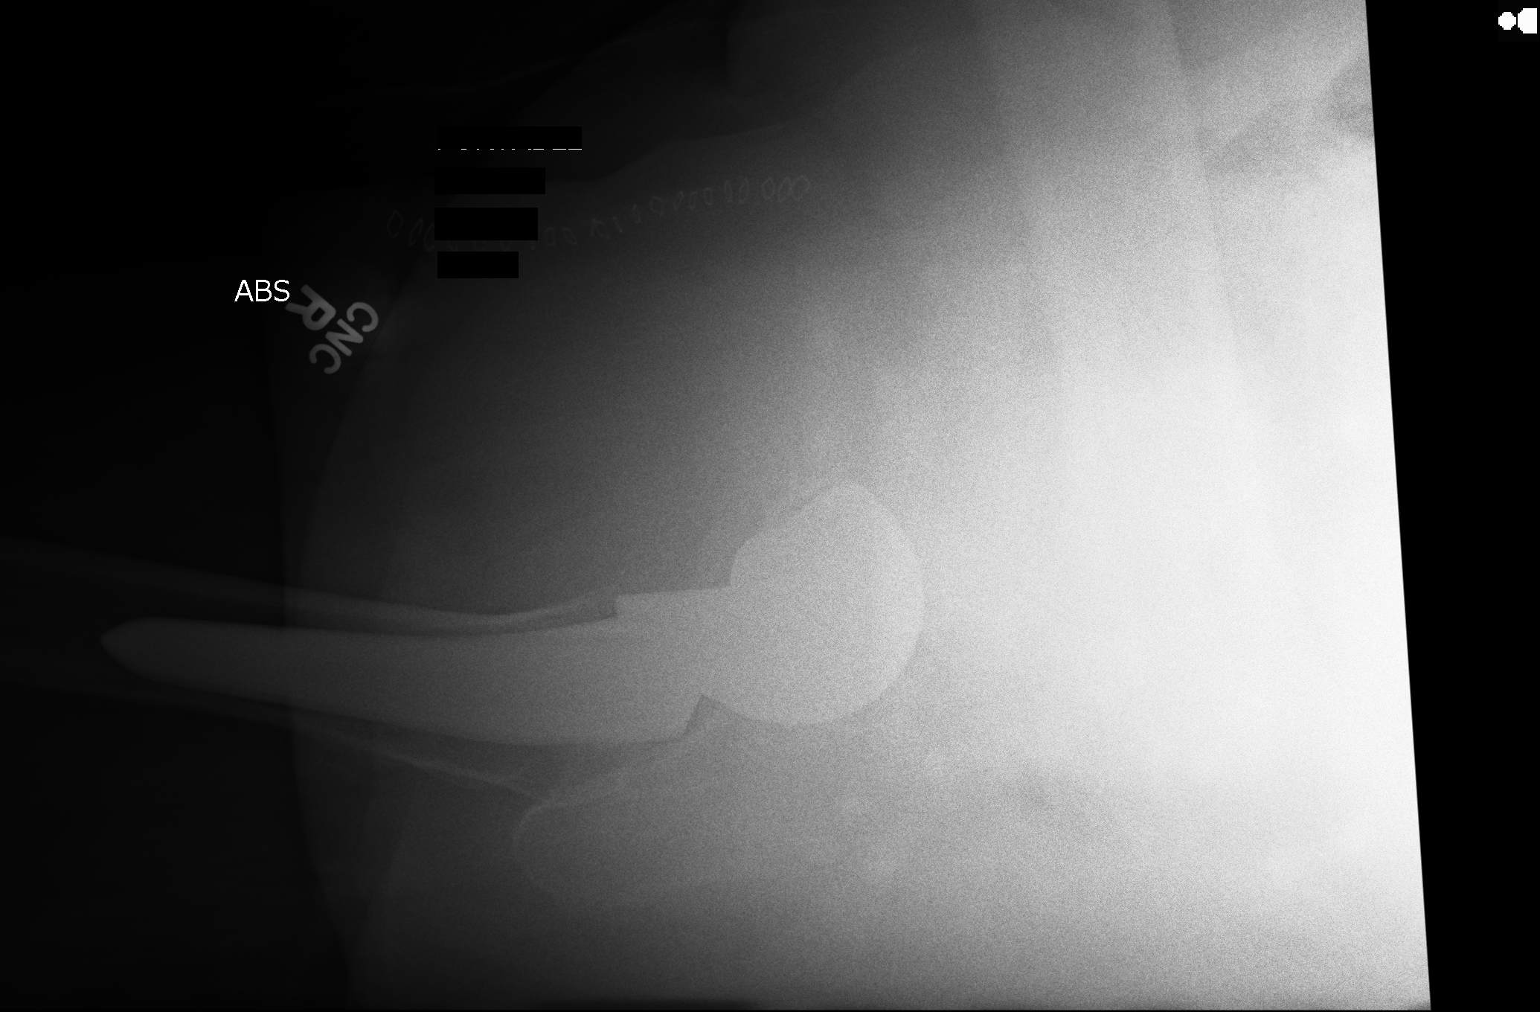

[2 of 2 positions shown; findings below may reference images not displayed]

FINDINGS: Post right total hip replacement. No evidence of hardware failure or
loosening. Alignment appears anatomic. ROSARII

Man Weng Malunes overlie the superior lateral aspect of the operative
site. Post paraspinal fusion involving the lower lumbar spine,
incompletely evaluated. No radiopaque foreign body.
IMPRESSION: Post right total hip replacement without evidence of complication.

## 2017-06-08 IMAGING — CT CT ANGIO CHEST
2 of 6 series · 18 of 46 positions shown · IV contrast (APPLIED)
Comparison: None.

CLINICAL DATA: Intermittent shortness of breath

EXAM:
CT ANGIOGRAPHY CHEST WITH CONTRAST
TECHNIQUE: Multidetector CT imaging of the chest was performed using the
standard protocol during bolus administration of intravenous
contrast. Multiplanar CT image reconstructions and MIPs were
obtained to evaluate the vascular anatomy.
CONTRAST:  100 cc of Isovue 370

[Series 5: pe thins 1.5 · axial · 0.68mm/px · z∈[-804,-559]mm · 15 of 229 slices shown]
[im 13/229  lung]
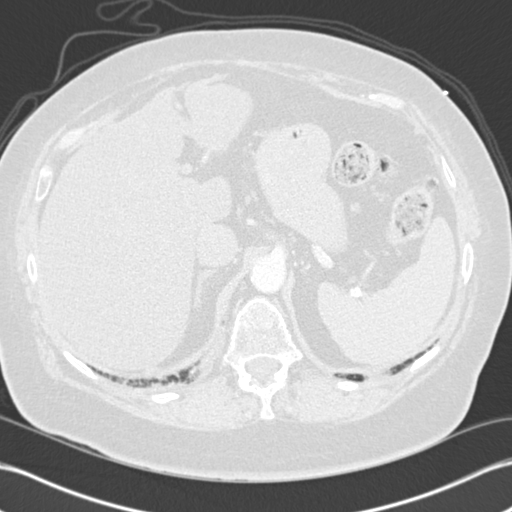
[im 25/229  soft-tissue]
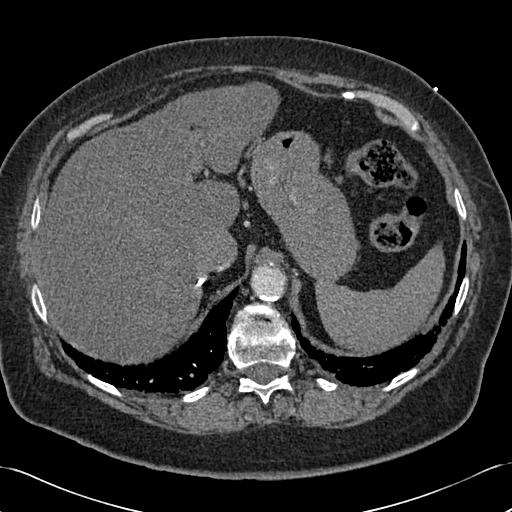
[im 49/229  lung]
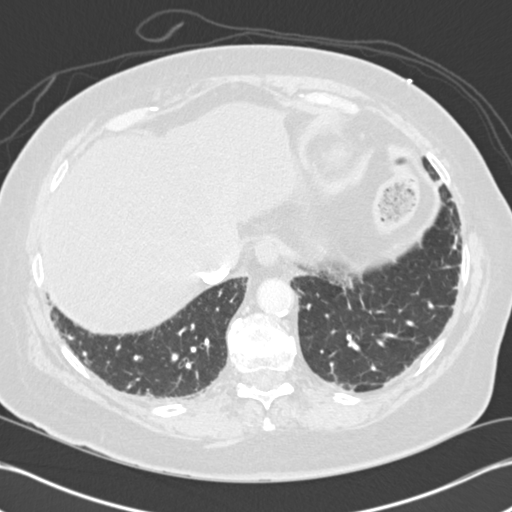
[im 61/229  soft-tissue]
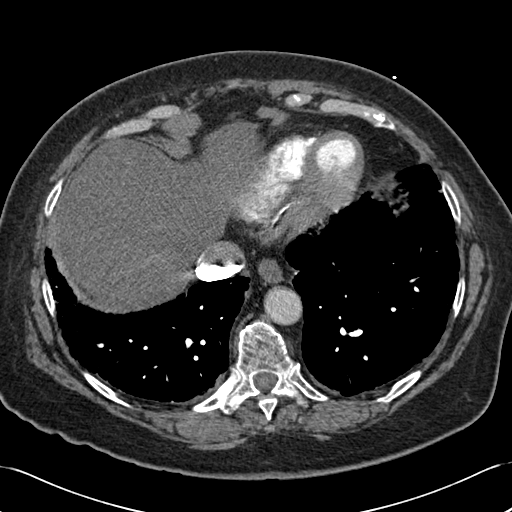
[im 73/229  lung]
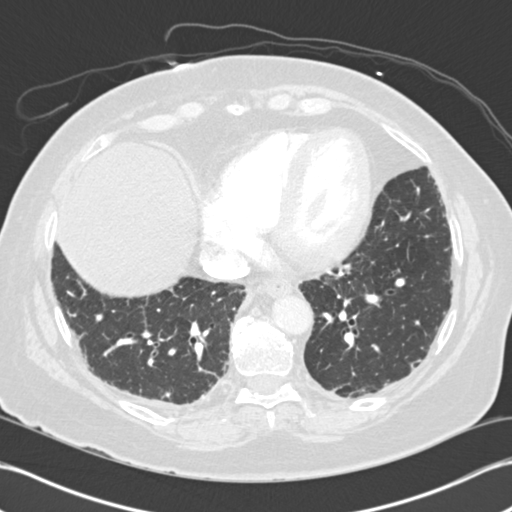
[im 85/229  soft-tissue]
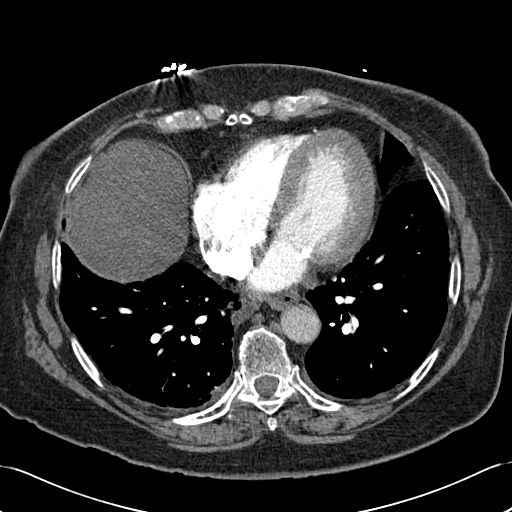
[im 97/229  lung]
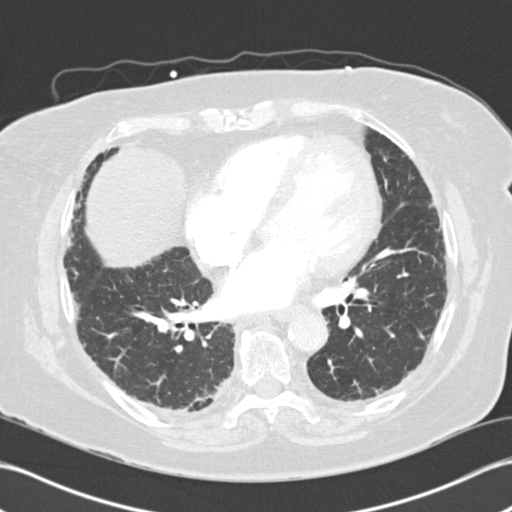
[im 121/229  soft-tissue]
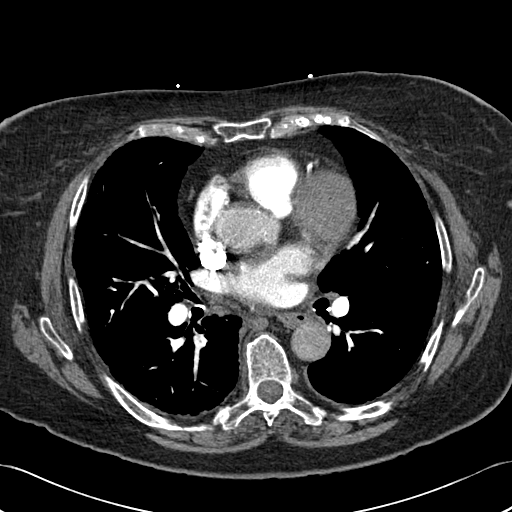
[im 133/229  lung]
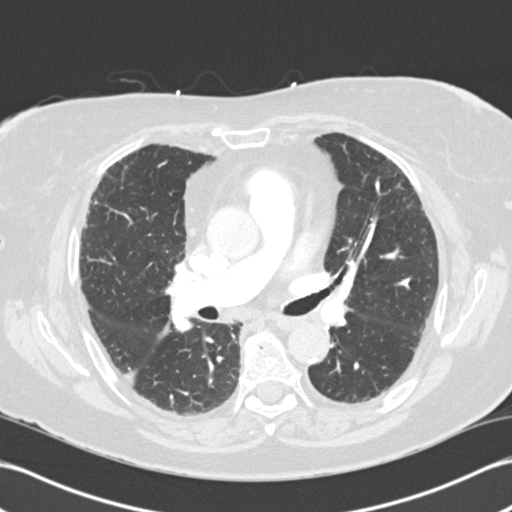
[im 145/229  soft-tissue]
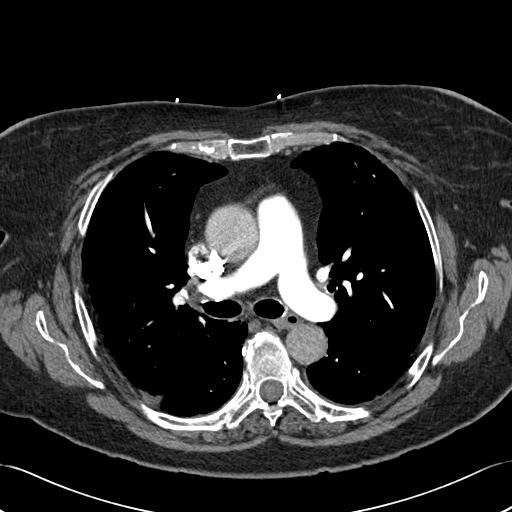
[im 157/229  lung]
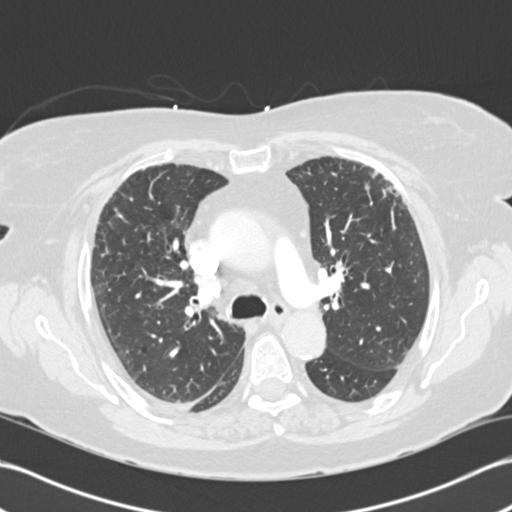
[im 169/229  soft-tissue]
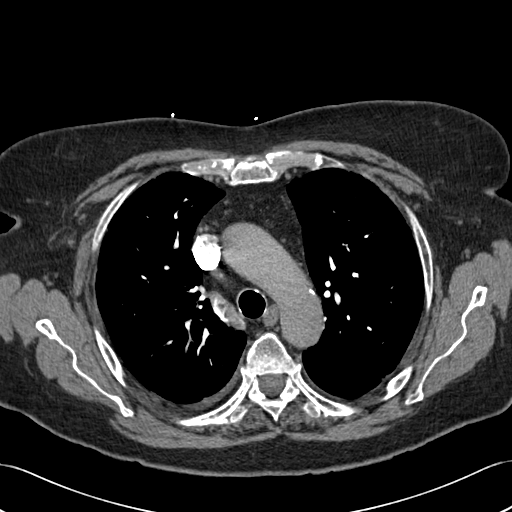
[im 193/229  lung]
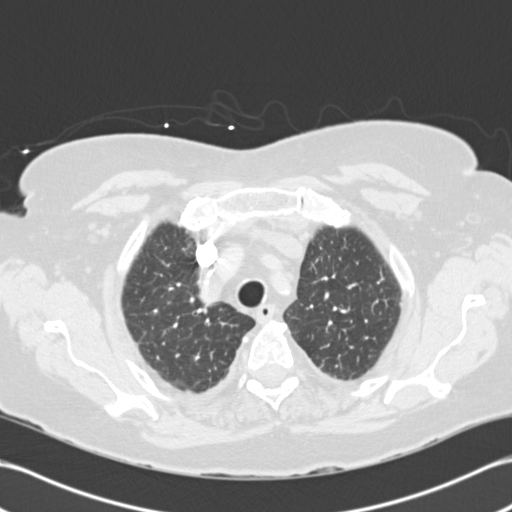
[im 205/229  soft-tissue]
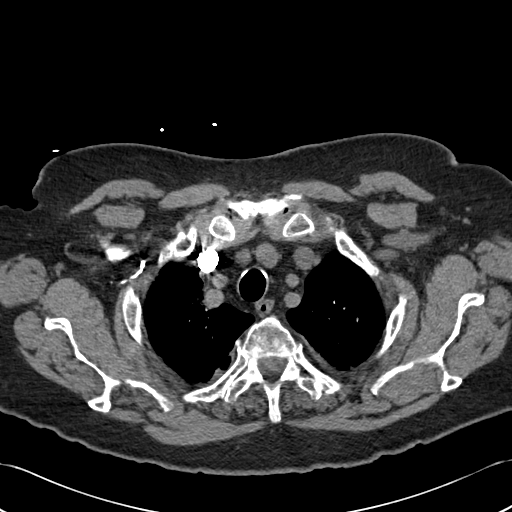
[im 217/229  lung]
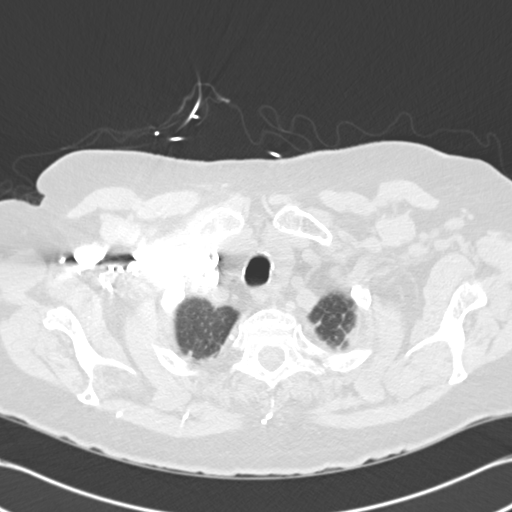

[Series 7: cor mpr 2.0 · coronal · 0.55mm/px · 3 of 151 slices shown]
[im 38/151  soft-tissue]
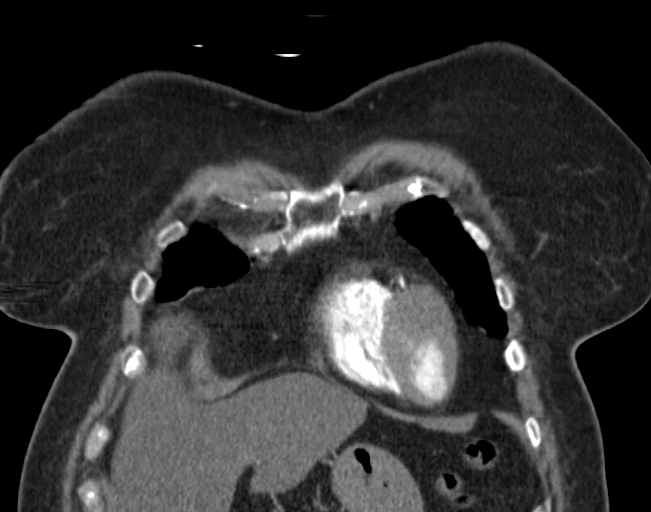
[im 76/151  soft-tissue]
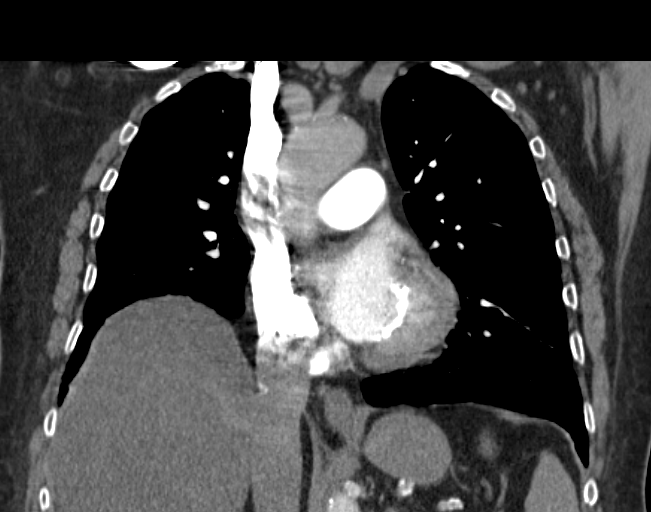
[im 113/151  soft-tissue]
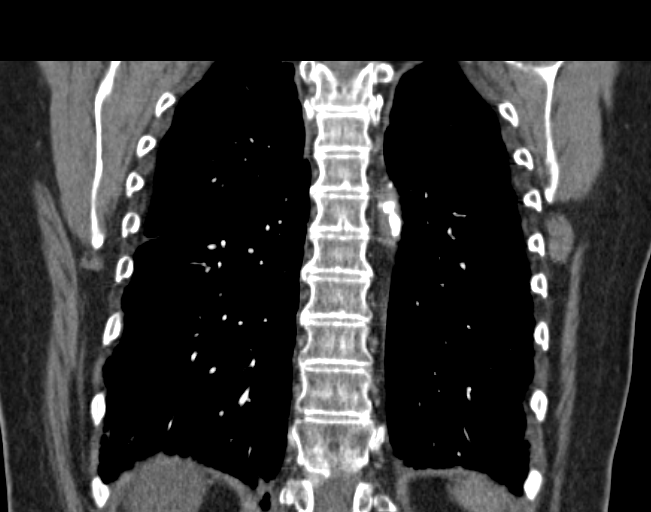

[18 of 46 positions shown; findings below may reference images not displayed]

FINDINGS: Mediastinum/Lymph Nodes: The heart size is mildly enlarged. Aortic
atherosclerosis noted. Calcification involving the LAD and left
circumflex coronary artery is identified. The trachea is patent and
appears midline. Normal appearance of the esophagus. Prominent
mediastinal lymph nodes are identified but no adenopathy is present.
The main pulmonary artery is patent. No lobar or segmental pulmonary
artery filling defects identified.

Lungs/Pleura: There is no pleural fluid identified. Peripheral
interstitial reticulation is identified within both lungs and
appears lower lobe predominant. Calcified granuloma is identified
within the right upper lobe. Traction bronchiectasis is present. For
example, image number 46 of series 6. No frank honeycombing
identified.

Upper abdomen: Diffuse hepatic steatosis is identified. Relative
hypertrophy the caudate lobe of liver noted. No focal liver
abnormality. The adrenal glands appear normal. Visualized portions
of the spleen are unremarkable.

Musculoskeletal: Spondylosis is identified within the thoracic
spine. No aggressive lytic or sclerotic bone lesions.

Review of the MIP images confirms the above findings.
IMPRESSION: 1. No evidence for acute pulmonary embolus.
2. Chronic interstitial changes including peripheral, subpleural
interstitial reticulation and traction bronchiectasis. Findings are
suggestive of nonspecific interstitial pneumonitis versus early
usual interstitial pneumonitis. Recommend followup high-resolution
CT of the chest in 6 months to assess for temporal change.
3. Aortic atherosclerosis and coronary artery calcifications
4. Hepatic steatosis and changes which may be suggestive of early
cirrhosis.
